# Patient Record
Sex: Male | Born: 1945 | Race: White | Hispanic: No | Marital: Married | Smoking: Never smoker
Health system: Southern US, Community
[De-identification: ages and names within clinical notes are randomized; demographics above are authoritative.]

## PROBLEM LIST (undated history)

## (undated) DIAGNOSIS — F419 Anxiety disorder, unspecified: Secondary | ICD-10-CM

## (undated) DIAGNOSIS — Z789 Other specified health status: Secondary | ICD-10-CM

## (undated) DIAGNOSIS — E7800 Pure hypercholesterolemia, unspecified: Secondary | ICD-10-CM

## (undated) DIAGNOSIS — R51 Headache: Secondary | ICD-10-CM

## (undated) DIAGNOSIS — K759 Inflammatory liver disease, unspecified: Secondary | ICD-10-CM

## (undated) DIAGNOSIS — D751 Secondary polycythemia: Secondary | ICD-10-CM

## (undated) DIAGNOSIS — R5382 Chronic fatigue, unspecified: Secondary | ICD-10-CM

## (undated) DIAGNOSIS — C4491 Basal cell carcinoma of skin, unspecified: Secondary | ICD-10-CM

## (undated) DIAGNOSIS — K5900 Constipation, unspecified: Secondary | ICD-10-CM

## (undated) DIAGNOSIS — I10 Essential (primary) hypertension: Secondary | ICD-10-CM

## (undated) DIAGNOSIS — K219 Gastro-esophageal reflux disease without esophagitis: Secondary | ICD-10-CM

## (undated) DIAGNOSIS — S060X9A Concussion with loss of consciousness of unspecified duration, initial encounter: Secondary | ICD-10-CM

## (undated) DIAGNOSIS — R413 Other amnesia: Secondary | ICD-10-CM

## (undated) DIAGNOSIS — K859 Acute pancreatitis without necrosis or infection, unspecified: Secondary | ICD-10-CM

## (undated) HISTORY — DX: Chronic fatigue, unspecified: R53.82

## (undated) HISTORY — DX: Other amnesia: R41.3

## (undated) HISTORY — PX: SHOULDER ARTHROSCOPY: SHX128

## (undated) HISTORY — DX: Other specified health status: Z78.9

## (undated) HISTORY — DX: Secondary polycythemia: D75.1

## (undated) HISTORY — PX: APPENDECTOMY: SHX54

## (undated) HISTORY — DX: Concussion with loss of consciousness of unspecified duration, initial encounter: S06.0X9A

## (undated) HISTORY — DX: Essential (primary) hypertension: I10

## (undated) HISTORY — DX: Headache: R51

## (undated) HISTORY — PX: MOHS SURGERY: SUR867

---
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
## 1989-05-05 HISTORY — PX: CATARACT EXTRACTION W/ INTRAOCULAR LENS  IMPLANT, BILATERAL: SHX1307

## 2000-09-25 ENCOUNTER — Encounter: Admission: RE | Admit: 2000-09-25 | Discharge: 2000-09-25 | Payer: Self-pay | Admitting: Family Medicine

## 2000-09-25 ENCOUNTER — Encounter: Payer: Self-pay | Admitting: Family Medicine

## 2004-09-04 HISTORY — PX: ORIF CLAVICLE FRACTURE: SUR924

## 2005-02-24 ENCOUNTER — Encounter: Admission: RE | Admit: 2005-02-24 | Discharge: 2005-02-24 | Payer: Self-pay | Admitting: Family Medicine

## 2005-02-26 ENCOUNTER — Encounter: Admission: RE | Admit: 2005-02-26 | Discharge: 2005-02-26 | Payer: Self-pay | Admitting: Family Medicine

## 2005-03-20 ENCOUNTER — Encounter: Admission: RE | Admit: 2005-03-20 | Discharge: 2005-03-20 | Payer: Self-pay | Admitting: Family Medicine

## 2005-04-08 ENCOUNTER — Encounter: Admission: RE | Admit: 2005-04-08 | Discharge: 2005-04-08 | Payer: Self-pay | Admitting: Family Medicine

## 2005-05-01 ENCOUNTER — Ambulatory Visit (HOSPITAL_COMMUNITY): Admission: RE | Admit: 2005-05-01 | Discharge: 2005-05-01 | Payer: Self-pay | Admitting: Specialist

## 2005-05-02 ENCOUNTER — Ambulatory Visit (HOSPITAL_COMMUNITY): Admission: RE | Admit: 2005-05-02 | Discharge: 2005-05-03 | Payer: Self-pay | Admitting: Orthopedic Surgery

## 2005-08-03 ENCOUNTER — Encounter: Admission: RE | Admit: 2005-08-03 | Discharge: 2005-08-03 | Payer: Self-pay | Admitting: Family Medicine

## 2005-09-04 HISTORY — PX: CLAVICLE HARDWARE REMOVAL: SHX1351

## 2005-12-30 ENCOUNTER — Encounter: Admission: RE | Admit: 2005-12-30 | Discharge: 2005-12-30 | Payer: Self-pay | Admitting: Neurological Surgery

## 2006-09-04 HISTORY — PX: US ECHOCARDIOGRAPHY: HXRAD669

## 2006-09-04 HISTORY — PX: CARDIAC CATHETERIZATION: SHX172

## 2006-10-12 ENCOUNTER — Encounter: Admission: RE | Admit: 2006-10-12 | Discharge: 2006-10-12 | Payer: Self-pay | Admitting: Neurology

## 2006-10-26 ENCOUNTER — Encounter: Admission: RE | Admit: 2006-10-26 | Discharge: 2006-10-26 | Payer: Self-pay | Admitting: Neurology

## 2006-11-12 ENCOUNTER — Encounter: Admission: RE | Admit: 2006-11-12 | Discharge: 2006-11-12 | Payer: Self-pay | Admitting: Family Medicine

## 2006-11-13 ENCOUNTER — Encounter: Admission: RE | Admit: 2006-11-13 | Discharge: 2006-11-13 | Payer: Self-pay | Admitting: Family Medicine

## 2006-11-22 ENCOUNTER — Ambulatory Visit (HOSPITAL_COMMUNITY): Admission: RE | Admit: 2006-11-22 | Discharge: 2006-11-22 | Payer: Self-pay | Admitting: Family Medicine

## 2006-12-04 ENCOUNTER — Ambulatory Visit: Payer: Self-pay

## 2006-12-04 ENCOUNTER — Encounter: Payer: Self-pay | Admitting: Internal Medicine

## 2007-01-24 ENCOUNTER — Ambulatory Visit: Payer: Self-pay | Admitting: Cardiology

## 2007-02-06 ENCOUNTER — Ambulatory Visit: Payer: Self-pay

## 2007-02-15 ENCOUNTER — Ambulatory Visit: Payer: Self-pay | Admitting: Cardiology

## 2007-03-14 ENCOUNTER — Ambulatory Visit: Payer: Self-pay | Admitting: Cardiology

## 2007-03-14 LAB — CONVERTED CEMR LAB
BUN: 10 mg/dL (ref 6–23)
Basophils Absolute: 0 10*3/uL (ref 0.0–0.1)
Creatinine, Ser: 0.9 mg/dL (ref 0.4–1.5)
Eosinophils Absolute: 0.2 10*3/uL (ref 0.0–0.6)
GFR calc Af Amer: 110 mL/min
GFR calc non Af Amer: 91 mL/min
HCT: 48.1 % (ref 39.0–52.0)
Hemoglobin: 16.6 g/dL (ref 13.0–17.0)
INR: 0.9 (ref 0.9–2.0)
MCHC: 34.5 g/dL (ref 30.0–36.0)
MCV: 97.1 fL (ref 78.0–100.0)
Monocytes Absolute: 0.8 10*3/uL — ABNORMAL HIGH (ref 0.2–0.7)
Monocytes Relative: 9.5 % (ref 3.0–11.0)
Neutrophils Relative %: 71.7 % (ref 43.0–77.0)
Potassium: 3.9 meq/L (ref 3.5–5.1)
RDW: 11.6 % (ref 11.5–14.6)
Sodium: 138 meq/L (ref 135–145)
aPTT: 26.7 s (ref 26.5–36.5)

## 2007-03-19 ENCOUNTER — Inpatient Hospital Stay (HOSPITAL_BASED_OUTPATIENT_CLINIC_OR_DEPARTMENT_OTHER): Admission: RE | Admit: 2007-03-19 | Discharge: 2007-03-19 | Payer: Self-pay | Admitting: Cardiology

## 2007-03-19 ENCOUNTER — Ambulatory Visit: Payer: Self-pay | Admitting: Cardiology

## 2007-04-10 ENCOUNTER — Ambulatory Visit: Payer: Self-pay | Admitting: Cardiology

## 2007-06-07 ENCOUNTER — Encounter: Admission: RE | Admit: 2007-06-07 | Discharge: 2007-06-07 | Payer: Self-pay | Admitting: Neurology

## 2008-01-23 ENCOUNTER — Encounter: Admission: RE | Admit: 2008-01-23 | Discharge: 2008-01-23 | Payer: Self-pay | Admitting: Family Medicine

## 2008-02-03 ENCOUNTER — Emergency Department (HOSPITAL_COMMUNITY): Admission: EM | Admit: 2008-02-03 | Discharge: 2008-02-03 | Payer: Self-pay | Admitting: Emergency Medicine

## 2008-02-14 ENCOUNTER — Encounter: Admission: RE | Admit: 2008-02-14 | Discharge: 2008-02-14 | Payer: Self-pay | Admitting: Family Medicine

## 2008-04-06 ENCOUNTER — Encounter: Admission: RE | Admit: 2008-04-06 | Discharge: 2008-04-06 | Payer: Self-pay | Admitting: Family Medicine

## 2008-08-03 ENCOUNTER — Encounter: Admission: RE | Admit: 2008-08-03 | Discharge: 2008-08-03 | Payer: Self-pay | Admitting: Neurological Surgery

## 2008-09-04 HISTORY — PX: ANTERIOR CERVICAL DECOMP/DISCECTOMY FUSION: SHX1161

## 2009-05-04 ENCOUNTER — Encounter: Admission: RE | Admit: 2009-05-04 | Discharge: 2009-05-04 | Payer: Self-pay | Admitting: Neurological Surgery

## 2009-05-18 ENCOUNTER — Ambulatory Visit (HOSPITAL_COMMUNITY): Admission: RE | Admit: 2009-05-18 | Discharge: 2009-05-18 | Payer: Self-pay | Admitting: Neurological Surgery

## 2009-05-20 ENCOUNTER — Observation Stay (HOSPITAL_COMMUNITY): Admission: RE | Admit: 2009-05-20 | Discharge: 2009-05-21 | Payer: Self-pay | Admitting: Neurological Surgery

## 2009-06-28 ENCOUNTER — Encounter: Admission: RE | Admit: 2009-06-28 | Discharge: 2009-06-28 | Payer: Self-pay | Admitting: Neurological Surgery

## 2009-08-09 ENCOUNTER — Encounter: Admission: RE | Admit: 2009-08-09 | Discharge: 2009-08-09 | Payer: Self-pay | Admitting: Neurological Surgery

## 2009-08-16 ENCOUNTER — Encounter: Admission: RE | Admit: 2009-08-16 | Discharge: 2009-08-16 | Payer: Self-pay | Admitting: Family Medicine

## 2009-11-08 ENCOUNTER — Encounter: Admission: RE | Admit: 2009-11-08 | Discharge: 2009-11-08 | Payer: Self-pay | Admitting: Neurological Surgery

## 2010-01-21 ENCOUNTER — Encounter: Admission: RE | Admit: 2010-01-21 | Discharge: 2010-01-21 | Payer: Self-pay | Admitting: Neurological Surgery

## 2010-07-27 ENCOUNTER — Ambulatory Visit: Payer: Self-pay | Admitting: Cardiology

## 2010-08-04 HISTORY — PX: OTHER SURGICAL HISTORY: SHX169

## 2010-08-19 ENCOUNTER — Ambulatory Visit: Payer: Self-pay | Admitting: Cardiology

## 2010-08-30 ENCOUNTER — Telehealth (INDEPENDENT_AMBULATORY_CARE_PROVIDER_SITE_OTHER): Payer: Self-pay | Admitting: *Deleted

## 2010-08-31 ENCOUNTER — Encounter (HOSPITAL_COMMUNITY)
Admission: RE | Admit: 2010-08-31 | Discharge: 2010-10-04 | Payer: Self-pay | Source: Home / Self Care | Attending: Cardiology | Admitting: Cardiology

## 2010-08-31 ENCOUNTER — Ambulatory Visit: Payer: Self-pay

## 2010-08-31 ENCOUNTER — Encounter: Payer: Self-pay | Admitting: Cardiology

## 2010-09-15 ENCOUNTER — Ambulatory Visit: Payer: Self-pay | Admitting: *Deleted

## 2010-09-25 ENCOUNTER — Encounter: Payer: Self-pay | Admitting: Family Medicine

## 2010-09-25 ENCOUNTER — Encounter: Payer: Self-pay | Admitting: Neurological Surgery

## 2010-09-25 ENCOUNTER — Encounter: Payer: Self-pay | Admitting: Neurology

## 2010-09-30 ENCOUNTER — Encounter
Admission: RE | Admit: 2010-09-30 | Discharge: 2010-09-30 | Payer: Self-pay | Source: Home / Self Care | Attending: Gastroenterology | Admitting: Gastroenterology

## 2010-10-05 ENCOUNTER — Ambulatory Visit (INDEPENDENT_AMBULATORY_CARE_PROVIDER_SITE_OTHER): Payer: Medicare Other | Admitting: *Deleted

## 2010-10-05 DIAGNOSIS — I10 Essential (primary) hypertension: Secondary | ICD-10-CM

## 2010-10-06 NOTE — Assessment & Plan Note (Signed)
Summary: Cardiology Nuclear Testing  Nuclear Med Background Indications for Stress Test: Evaluation for Ischemia, Abnormal EKG  Indications Comments: Pending exercise program.  History: Echo, Heart Catheterization, Myocardial Perfusion Study  History Comments: '08 MPS:?inferior wall scar, no ischemia, EF=47%; '08 Cath:n/o CAD, EF=53%  Symptoms: Chest Pain, Chest Pressure, DOE, Fatigue, Fatigue with Exertion, Light-Headedness    Nuclear Pre-Procedure Cardiac Risk Factors: History of Smoking, Hypertension, Lipids, Overweight Caffeine/Decaff Intake: none NPO After: 8:00 PM Lungs: Clear IV 0.9% NS with Angio Cath: 18g     IV Site: R Antecubital IV Started by: Stanton Kidney, EMT-P Chest Size (in) 42     Height (in): 74 Weight (lb): 208 BMI: 26.80 Tech Comments: Metoprolol held > 48 hrs, per Pt.  Nuclear Med Study 1 or 2 day study:  1 day     Stress Test Type:  Stress Reading MD:  Olga Millers, MD     Referring MD:  Cassell Clement Resting Radionuclide:  Technetium 34m Tetrofosmin     Resting Radionuclide Dose:  11.0 mCi  Stress Radionuclide:  Technetium 54m Tetrofosmin     Stress Radionuclide Dose:  33.0 mCi   Stress Protocol Exercise Time (min):  4:45 min     Max HR:  121 bpm     Predicted Max HR:  155 bpm  Max Systolic BP: 205 mm Hg     Percent Max HR:  78.06 %     METS: 5.8 Rate Pressure Product:  40102  Lexiscan: 0.4 mg   Stress Test Technologist:  Irean Hong,  RN     Nuclear Technologist:  Doyne Keel, CNMT  Rest Procedure  Myocardial perfusion imaging was performed at rest 45 minutes following the intravenous administration of Technetium 47m Tetrofosmin.  Stress Procedure  The patient attempted to walk treadmill utilizing the Bruce protocol for 4:45, but the treadmill was stopped due to a hypertensive response, BP 205/119 off metoprolol 48 hrs.The patient received IV Lexiscan 0.4 mg over 15-seconds.  Technetium 31m Tetrofosmin injected at 30-seconds.  There were no  significant changes, rare PVC.  Quantitative spect images were obtained after a 45 minute delay.Toprol xl 25 mg 1/2 tablet given after lexiscan, and BP 160/90 after 2nd images.  QPS Raw Data Images:  There is no interference from other nuclear activity. Stress Images:  There is decreased uptake in the apex. Rest Images:  There is decreased uptake in the apex. Subtraction (SDS):  No evidence of ischemia. Transient Ischemic Dilatation:  1.12  (Normal <1.22)  Lung/Heart Ratio:  .29  (Normal <0.45)  Quantitative Gated Spect Images QGS EDV:  132 ml QGS ESV:  67 ml QGS EF:  49 % QGS cine images:  Normal wall motion; LV function visually appears better than calculated EF; suggest echo to better evaluate; LVE.   Overall Impression  Exercise Capacity: Lexiscan with low level exercise BP Response: Hypertensive blood pressure response. Clinical Symptoms: No chest pain ECG Impression: No significant ST segment change suggestive of ischemia. Overall Impression: Normal lexiscan nuclear study with apical thinning but no ischemia.  Appended Document: Cardiology Nuclear Testing COPY SENT TO DR.BRACKBILL

## 2010-10-06 NOTE — Progress Notes (Signed)
Summary: Nuclear pre procedure  Phone Note Outgoing Call Call back at [facility] Phone [phone]   Call placed by: Rea College, CMA,  August 30, 2010 5:21 PM Call placed to: Patient Summary of Call: Left message with information on Myoview Information Sheet (see scanned document for details).      Nuclear Med Background Indications for Stress Test: Evaluation for Ischemia, Abnormal EKG   History: Echo, Heart Catheterization, Myocardial Perfusion Study  History Comments: '08 MPS:?inferior wall scar, no ischemia, EF=47%; '08 Cath:n/o CAD, EF=53%  Symptoms: DOE, Light-Headedness    Nuclear Pre-Procedure Cardiac Risk Factors: Hypertension, Lipids, Overweight

## 2010-11-10 ENCOUNTER — Ambulatory Visit: Payer: Medicare Other | Admitting: Cardiology

## 2010-11-17 ENCOUNTER — Ambulatory Visit (INDEPENDENT_AMBULATORY_CARE_PROVIDER_SITE_OTHER): Payer: Medicare Other | Admitting: Cardiology

## 2010-11-17 DIAGNOSIS — Z79899 Other long term (current) drug therapy: Secondary | ICD-10-CM

## 2010-11-17 DIAGNOSIS — I119 Hypertensive heart disease without heart failure: Secondary | ICD-10-CM

## 2010-12-01 ENCOUNTER — Ambulatory Visit: Payer: Medicare Other | Admitting: Cardiology

## 2010-12-09 LAB — DIFFERENTIAL
Eosinophils Absolute: 0.1 10*3/uL (ref 0.0–0.7)
Lymphocytes Relative: 34 % (ref 12–46)
Lymphs Abs: 1.6 10*3/uL (ref 0.7–4.0)
Monocytes Relative: 10 % (ref 3–12)
Neutro Abs: 2.5 10*3/uL (ref 1.7–7.7)
Neutrophils Relative %: 53 % (ref 43–77)

## 2010-12-09 LAB — BASIC METABOLIC PANEL
Calcium: 9.7 mg/dL (ref 8.4–10.5)
Creatinine, Ser: 1.04 mg/dL (ref 0.4–1.5)
GFR calc non Af Amer: >60 mL/min (ref >60)
Sodium: 140 mEq/L (ref 135–145)

## 2010-12-09 LAB — APTT: aPTT: 25 seconds (ref 24–37)

## 2010-12-09 LAB — PROTIME-INR
INR: 1.1 (ref 0.00–1.49)
Prothrombin Time: 13.7 seconds (ref 11.6–15.2)

## 2010-12-09 LAB — CBC
MCV: 101 fL — ABNORMAL HIGH (ref 78.0–100.0)
Platelets: 219 10*3/uL (ref 150–400)
RBC: 5.43 MIL/uL (ref 4.22–5.81)
WBC: 4.7 10*3/uL (ref 4.0–10.5)

## 2011-01-16 ENCOUNTER — Encounter: Payer: Self-pay | Admitting: *Deleted

## 2011-01-17 ENCOUNTER — Ambulatory Visit: Payer: Medicare Other | Admitting: Cardiology

## 2011-01-17 NOTE — Assessment & Plan Note (Signed)
[facility]                            CARDIOLOGY OFFICE NOTE   [name], [name]                          MRN:          [id number]  DATE:03/14/2007                            DOB:          10/15/1945    Brian Villegas is here for follow up. Please review my notes of 01/24/2007  and 02/15/2007. It is time to proceed with cardiac catheterization. The  patient has been away on a trip. He did develop a significant episode of  bronchitis and came from traveling in [place]. He has not had any severe  chest pain. Note that Dr. Delton Coombes had thought that from the pulmonary  function studies that there might be some type of upper airway or vocal  cord dysfunction. This might need further review and the patient may  still need further pulmonary workup. However, his echocardiogram raised  the question of a small abnormality and his nuclear scan raised the  question of a small abnormality in the inferior wall and this raises the  question of some inferior injury. It is time to proceed with  catheterization to be definitive in the diagnosis.   The patient is here with his wife today and I re-explained the entire  situation to him.   PAST MEDICAL HISTORY:   ALLERGIES:  CODEINE.   MEDICATIONS:  1. Ambien.  2. Aspirin 325 mg.  3. Metoprolol 12.5 mg daily.   OTHER MEDICAL PROBLEMS:  See the list below.   REVIEW OF SYSTEMS:  He is having some fatigue and some sinus  difficulties. He does have some shortness of breath with exertion.  Otherwise, his review of systems is negative.   PHYSICAL EXAMINATION:  VITAL SIGNS:  Weight 190 pounds. Blood pressure  is 138/95.   I did not examine the patient today. Most recently, his exam has  revealed no major murmurs and no major abnormalities. The patient  is  stable.   PROBLEMS:  1. CODEINE allergy.  2. History of some type of alcohol made by friends in the past. I do      not know if heavy metal studies were done, but they  may have been      done through Dr. Sandria Manly.  3. Overall fatigue.  4. Intermittent dyspnea, etiology remains unclear.  5. Some type of neurologic paresthesias with no definite diagnosis      that I am aware of.  6. Elevated LDL.  7. Polycythemia by history.  8. Macrocytosis.  9. 2D echocardiogram with a question of an inferior wall motion      abnormality and a stress Myoview with question of slight      abnormality in the inferior wall.  10.Sleep disorder.  11.Question of spinal stenosis on neurologic exam. He has not had a      myelogram. He has had heavy metal studies. They appear to be      normal, but my copy was not clear.  12.Pulmonary function studies by Dr. Delton Coombes. He may have some type of      upper airway or vocal cord dysfunction.  13.We  need to proceed with catheterization. The patient and his wife      are in agreement. After this, we will see about the next step. He      may need a cardiopulmonary exercise test.  14.High blood pressure. As we see him on follow up visits, his      diastolic pressure continues to be mildly elevated. I do believe      that this should be treated now that we have seen it on several      occasions.     Luis Abed, MD, [facility]  Electronically Signed    JDK/MedQ  DD: 03/14/2007  DT: 03/15/2007  Job #: [id number]   cc:   Mosetta Putt, M.D.

## 2011-01-17 NOTE — Assessment & Plan Note (Signed)
[facility]                            CARDIOLOGY OFFICE NOTE   RAFEAL, SKIBICKI                          MRN:          [id number]  DATE:04/10/2007                            DOB:          1945-12-12    POST-HOSPITAL OFFICE VISIT:   PRIMARY CARE PHYSICIAN:  Mosetta Putt, M.D.   SUBJECTIVE:  This is a 65 year old married white male patient who  underwent a cardiac catheterization on March 19, 2007, after an  evaluation for dyspnea and abnormal echocardiogram, suggesting inferior  wall hypokinesis and abnormal Cardiolite, suggesting inferior wall basal  and mild regional wall motion abnormalities, with overall preserved  ejection fraction but no ischemia or infarction.  The cardiac  catheterization was performed by Dr. Rollene Rotunda, and revealed mild  coronary plaque and overall well-preserved ejection fraction with  perhaps a mild inferior wall motion abnormality.  He recommended primary  risk reduction.  His LV end diastolic pressure was not elevated at the  time of the cardiac catheterization and did not suggest significant  diastolic dysfunction; however, this was suggested by echocardiogram,  and he felt he could be continued to be treated for this.   Since the patient has been home he is doing quite well.  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  He stopped taking his metoprolol because of his  catheterization results, but his blood pressure is elevated.   CURRENT MEDICATIONS:  Aspirin 325 mg daily.   PHYSICAL EXAMINATION:  GENERAL:  This is a pleasant 65 year old white  male, in no acute distress.  VITAL SIGNS:  Blood pressure 160/100, pulse 68, weight 193 pounds.  NECK:  Without jugular venous distention, HJR or bruit or thyroid  enlargement.  LUNGS:  Clear anterior, posterior and lateral.  HEART:  Regular rate and rhythm at 68 beats per minute.  Normal S1 and  S2.  No murmur, rub, bruit, thrill or heave noted.  ABDOMEN:  Soft, without organomegaly, masses, lesions or abnormal  tenderness.  Right groin without hematoma or hemorrhage.  EXTREMITIES:  Good distal pulses.  Extremities without clubbing,  cyanosis or edema.   IMPRESSION:  1. A 2-dimensional echocardiogram suggestive of diastolic dysfunction.      Does not correlate with the cardiac catheterization.  2. Mild coronary plaque with overall preserved ejection fraction with      perhaps mild inferior wall motion abnormality.  3. Hypertension, uncontrolled.  4. Intermittent dyspnea.  5. History of neurological paresthesia with no definite diagnosis.  6. Macrocytosis.  7. History of sleep disorder.  8. Question of spinal stenosis.   PLAN:  At this time I have asked the patient to resume his beta blocker  at 12.5 mg and have this followed with Dr. Mosetta Putt.  I told him  it is probably not wise to play racket ball at a high intensity level  until his blood pressure is under better control.  He may need to be  switched to another agent, as he had heart  rates in the 50's when he was  here in May, to get his blood pressure under better control.   FOLLOWUP:  He will see Dr. Luis Abed in about four months' time,  or sooner if needed.      Jacolyn Reedy, PA-C  Electronically Signed      Jesse Sans. Daleen Squibb, MD, [facility]  Electronically Signed   ML/MedQ  DD: 04/10/2007  DT: 04/10/2007  Job #: [id number]   cc:   Mosetta Putt, M.D.

## 2011-01-17 NOTE — Cardiovascular Report (Signed)
NAMEKHING, BELCHER NO.:  [id number]   MEDICAL RECORD NO.:  [id number]          PATIENT TYPE:  OIB   LOCATION:  1963                         FACILITY:  [facility]   PHYSICIAN:  Rollene Rotunda, MD, FACCDATE OF BIRTH:  10-22-1945   DATE OF PROCEDURE:  03/19/2007  DATE OF DISCHARGE:  03/19/2007                            CARDIAC CATHETERIZATION   PRIMARY CARE PHYSICIAN:  Mosetta Putt, M.D.   CARDIOLOGIST:  Luis Abed, MD, [facility].   PROCEDURE:  Left heart catheterization and coronary arteriography.   INDICATIONS:  Evaluate patient with dyspnea, abnormal echocardiogram  suggesting inferior wall hypokinesis, and an abnormal Cardiolite  suggesting inferior wall basal and mid regional wall motion  abnormalities, slight, with an overall well-preserved ejection fraction  but no ischemia or infarct.   PROCEDURE NOTE:  Left heart catheterization performed via the right  femoral artery.  The artery was cannulated using anterior wall puncture.  A #4-French arterial sheath was inserted via the modified Seldinger  technique.  Preformed Judkins and pigtail catheter were utilized.  The  patient tolerated the procedure well and left the lab in stable  condition.   RESULTS:   HEMODYNAMICS:  LV 149/17, AO 150/76.   CORONARIES:  Left main was normal.   The LAD was large, wrapping the apex.  There were proximal luminal  irregularities.  There were small diagonals which were free of any  disease.   The circumflex was a dominant vessel, very large in the AV groove.  It  was normal throughout its course in the AV groove. There was a very  large first obtuse marginal which was normal.  There was a  posterolateral which was moderate size and normal.  There was a PDA  which was moderate size and normal.   Right coronary artery is a nondominant vessel and normal.   LEFT VENTRICULOGRAM:  Left ventriculogram was obtained in the RAO  projection.  The EF was about  55% with mild  inferior hypokinesis.   CONCLUSION:  1. Mild coronary plaque.  2. Overall well-preserved ejection fraction with perhaps a mild      inferior wall motion abnormality.   PLAN:  The patient will continue to have primary risk reduction.  Of  note, his LV end-diastolic pressures were not elevated at this  catheterization and did not suggest significant diastolic dysfunction.  However, this was suggested by the echocardiogram.  He could continue to  be treated for this.  He also will follow up with Dr. Myrtis Ser and Dr.  Duaine Dredge for further potential evaluation of dyspnea.      Rollene Rotunda, MD, [facility]  Electronically Signed     JH/MEDQ  D:  03/19/2007  T:  03/19/2007  Job:  [id number]   cc:   Mosetta Putt, M.D.

## 2011-01-17 NOTE — Assessment & Plan Note (Signed)
Brian Villegas                            CARDIOLOGY OFFICE NOTE   Brian, Villegas                          MRN:          [id number]  DATE:01/24/2007                            DOB:          02/05/1946    [name] is here for cardiac evaluation.  He is active, including  playing racquetball.  He is 65 years of age.  He is bothered by several  different symptoms, which have been very difficult to evaluate.  Dr.  Duaine Villegas has been working with him to try to help assess multiple  issues.  These issues have included fatigue and dyspnea.  In fact,  several years ago he had an unusual feeling in his leg.  Eventually,  this included some paresthesias.  He had an extensive workup by Dr.  Sandria Villegas.  The patient tells me that there have been no marked abnormalities  found.  The patient has also seen Dr. Liz Villegas.  He has also had an  evaluation by Dr. Danielle Villegas, and Dr. Andrey Villegas.  There is a history of some  alcohol homemade by friends.  He had pulmonary function studies done at  [facility].  The results were read by Dr. Levy Villegas.  There were pre  and postbronchodilator evaluations.  I do not see a listing for  diffusion capacity.  FVC, FEV1, FEV1/FVC ratio, and FEF 25% to 75% are  all within normal.  The MVV is within normal.  There was no significant  response with vasodilators.  Comment from Dr. Delton Villegas, is that there is  normal airflow without a bronchodilator response.  Review of the  inspiratory flow/volume loop is suggestive of the possibility of  variable upper airway obstruction or vocal cord dysfunction.  The  patient's hemoglobin has been 45.  I have other labs, which were faxed,  and I will need to reassess with better copies.  HDL was 63, LDL was  162, and TSH was normal.  There was an assessment of VMA, epinephrine,  norepinephrine, and metanephrines.  It would appear that these are in  the normal range.  Another hemoglobin is 18.4, and  hematocrit 54,  compatible with some polycythemia.  Also, patient had a 2 dimensional  echo.  This was done in our office on December 04, 2006.  The study showed  an ejection fraction of 50% to 55%.  There is question of some  hypokinesis of the basal and mid inferior wall.  There is also question  of some diastolic dysfunction.  I have not yet personally reviewed this  echo.   Brian Villegas tells me that he is still able to play racquetball.  He feels  fatigued afterwards, but does not have chest pain or shortness of breath  while playing.  Her intermittently has shortness of breath when walking  up stairs towards his office.  He has not had syncope or presyncope.   PAST MEDICAL HISTORY:   ALLERGIES:  HE IS ALLERGIC TO CODEINE.   MEDICATIONS:  At the current time, he is using only Ambien.   REVIEW OF SYSTEMS:  See the HPI.  Another issue that has been raised is  that his blood pressure seems to be higher today than it has been  recently.  He has some mild dizziness.  He has some seasonal allergies.  Otherwise, his review of systems is negative.  He does have some  tinnitus.   SOCIAL HISTORY:  The patient is [employer]  He does  not smoke.  As noted above, he has over time had some alcoholic  beverages made by friends.   FAMILY HISTORY:  There is no strong family history of coronary disease.   PHYSICAL EXAMINATION:  Blood pressure today varies from 140 to 160 over  95 to 105.  He has not had pressures in this range before, and this can  be followed for today.  His weight is 193 pounds.  Patient is oriented to person, time, and place.  Affect is normal.  He has a mildly cyanotic tint to his skin.  There is no xanthelasma.  There is normal extraocular motion.  Conjunctivae are normal.  There is no jugular venous distention.  There are no carotid bruits.  LUNGS:  Clear.  Respiratory effort is not labored.  CARDIAC:  Exam reveals an S1 with an S2.  There are no  clicks or  significant murmurs.  ABDOMEN:  Soft.  There are no masses or bruits.  There are normal bowel  sounds.  He has 2+ distal pulses.  There is no peripheral edema.  There are no major musculoskeletal deformities.  There is a slight  variation in his left clavicular area, and his left shoulder, related to  some reconstructive surgery that he has had.   IMPRESSION:  At this point, I am not sure of the exact etiology of many  of his symptoms.  Problems are listed below, and we will proceed with  further evaluation.   PROBLEMS:  Include:  1. Codeine allergy.  2. History of having had some alcohol in the past made by some      friends.  I do not know if any type of chemical studies or heavy      metal studies have been done, but may have been done through Dr.      Sandria Villegas.  3. Overall fatigue.  4. Intermittent dyspnea.  Etiology remains unclear.  We need to be      sure that he does not have any anginal equivalent.  5. Some type of neurologic paresthesias with no definite diagnosis      made that I am aware of.  6. LDL 162.  This will have to be approached after we understand his      overall status better.  7. Skin tone that appears somewhat cyanotic.  8. Polycythemia.  9. Macrocytosis.  10.A 2-D echo showing question of an inferior wall motion abnormality.      He will have a stress Myoview scan to rule out ischemic disease.  11.Sleep disorder.  12.Question of spinal stenosis related to the neurologic exam.      However, he has not had a myelogram.  I see now from the labs, that      he has had heavy metal studies.  I will obtain a better copy, but I      am assuming that these are normal.  13.Pulmonary function studies as outlined above.  In Dr. Kavin Leech note,      there is the issue of the possibility of some type of upper  airway      obstruction or vocal cord dysfunction, and I will have to see if     this has been assessed or considered any further.   We will start with a  stress Myoview scan.  I will then see him back for  followup.  I will review his 2-D echo to personally look at the question  of the inferior wall motion abnormality.  It is possible that he might  need a cardiopulmonary exercise test to further assess his exercise  capacity, and  get a better feel for whether or not there is a true cardiovascular or  pulmonary component to it.  I will see him back for followup.     Luis Abed, MD, [facility]  Electronically Signed    JDK/MedQ  DD: 01/24/2007  DT: 01/24/2007  Job #: [id number]   cc:   Mosetta Putt, M.D.

## 2011-01-17 NOTE — Assessment & Plan Note (Signed)
[facility]                            CARDIOLOGY OFFICE NOTE   Brian, Villegas                          MRN:          [id number]  DATE:02/15/2007                            DOB:          30-Jul-1946    I saw [name] with a complete evaluation on Jan 24, 2007. At that  time, one of the issues was intermittent dyspnea. The exact etiology  remains unclear. Dr. Delton Coombes had thought from his pulmonary function  studies that there was a possibility that he had some type of upper  airway obstruction or vocal cord dysfunction that was intermittent. This  may yet need further review. From my view point, I felt that it was  important to do a Myoview scan. This was done with an adenosine study.  The study shows that there is decreased activity at the base of the  inferolateral wall. This seems similar at rest and stress. There was no  marked reversibility. There appeared to be some relative inferior  hypokinesis and therefore this area could possibly be a small area of  scar. There was no definite ischemia.   I reviewed this data and reviewed the data completely with the patient  today. I explained to him that it is possible that this could be a real  finding with a small amount of old injury in this area. I had reviewed  the echo. I will re-review my thoughts on his echo. However, I made it  clear to him that this raised the question that it is possible that  there could be a mild abnormality in this area. This is a low risk scan.  He is getting ready to travel for several weeks and I feel it is safe  for him to do so. I decided to put him on an aspirin a day and give him  nitroglycerin to carry and put him on a small dose of a beta blocker. I  will then see him back. He arrives back on July 7th. I will see him on  July 10 and we will make a final plan for outpatient catheterization.   PAST MEDICAL HISTORY:   ALLERGIES:  CODEINE.   MEDICATIONS:  Ambien  and medications added today including aspirin,  p.r.n. nitroglycerin, and low dose metoprolol extended release.   OTHER MEDICAL PROBLEMS:  See the extensive note on Jan 24, 2007.   REVIEW OF SYSTEMS:  Since being here, he has not had any major problems.   LIMITED PHYSICAL EXAMINATION:  Blood pressure today is 150/92 with a  pulse of 78 and as mentioned we are adding a beta blocker. This pressure  is better than at the time of his last visit. I am hesitant to add other  medications as he is getting ready to travel out of town.   Problems are listed extensively on my note of Jan 24, 2007. It is  possible that his dyspnea could be an anginal equivalent but there is no  proof of that at this time. I reviewed all of the  information very carefully as outlined. I  will see him back on July 10  and we will finalize proceeding heart catheterization at that time.     Brian Abed, MD, [facility]  Electronically Signed    JDK/MedQ  DD: 02/15/2007  DT: 02/15/2007  Job #: [id number]   cc:   Brian Villegas, M.D.

## 2011-01-20 NOTE — Op Note (Signed)
Brian Villegas, Brian Villegas                 ACCOUNT NO.:  [id number]   MEDICAL RECORD NO.:  [id number]          PATIENT TYPE:  OIB   LOCATION:  5022                         FACILITY:  [facility]   PHYSICIAN:  Almedia Balls. Ranell Patrick, M.D. DATE OF BIRTH:  10-03-1945   DATE OF PROCEDURE:  05/02/2005  DATE OF DISCHARGE:  05/03/2005                                 OPERATIVE REPORT   PREOPERATIVE DIAGNOSIS:  Left clavicle fracture, comminuted and displaced.   POSTOPERATIVE DIAGNOSIS:  Left clavicle fracture, comminuted and displaced.   OPERATION PERFORMED:  Open reduction and internal fixation, left clavicle,  using intramedullary pin by DePuy.   SURGEON:  Almedia Balls. Ranell Patrick, M.D.   ASSISTANT:  __________ , P. A.-C   ANESTHESIA:  General anesthesia.   ESTIMATED BLOOD LOSS:  The estimated blood loss was minimal.   FLUIDS REPLACED:  Fluid replacement was 1,200 mL of crystalloid.   COUNTS:  Instrument count was correct.   COMPLICATIONS:  There were no complications.   MEDICATIONS:  No antibiotics given.   INDICATIONS:  The patient is a 65 year old male with a history of a left  displaced comminuted clavicle fracture who presents with obvious shortening  of his left upper extremity.  The patient desires operative treatment to  restore clavicle length and alignment, and to insure proper healing.  Informed consent was obtained.   DESCRIPTION OF THE OPERATION:  After an adequate level of anesthesia was  achieved the patient was positioned in the modified beach chair position.  The C-arm was brought in to visualize the fracture during surgery.  The  shoulder was sterilely prepped and draped in the usual manner.  The C-arm  was brought over the top of the clavicle.   We placed a long Langer's line incision directly overlying the clavicular  fracture.  We dissected down to the platysma and trapezius muscle fibers.  Protecting the subcutaneous nerves we identified the fracture site.  We were  able to  mobilize both ends of the fracture site without undo soft tissue  stripping. We then drilled out the medial and lateral fragments and then  retrograded a DePuy clavicle pin out the lateral fragment.  We then reduced  the fracture and antegraded the pin across the medial fracture.  This gained  outstanding alignment of the fracture as visualized by the C-arm and as  visualized clinically through the wound.   We thoroughly irrigated the wound. We approximated local bone that was still  attached to the soft tissue, and used a single 0-Vicryl suture to hold the  bone fragments in place.  We then closed the platysma muscle over the top of  the clavicle utilizing interrupted 2-0 Vicryl suture followed by 2-0 Vicryl  subcutaneous and 4-0 Monocryl for the skin.  The posterior wound was closed  in a layered closure as well.   The patient was awakened, placed in a shoulder sling and taken to the  recovery room in stable condition.           ______________________________  Almedia Balls Ranell Patrick, M.D.     SRN/MEDQ  D:  06/09/2005  T:  06/09/2005  Job:  [id number]

## 2011-01-20 NOTE — Op Note (Signed)
Brian Villegas, Brian Villegas                 ACCOUNT NO.:  [id number]   MEDICAL RECORD NO.:  [id number]          PATIENT TYPE:  OIB   LOCATION:                               FACILITY:  [facility]   PHYSICIAN:  Almedia Balls. Ranell Patrick, M.D. DATE OF BIRTH:  10/14/1945   DATE OF PROCEDURE:  05/02/2005  DATE OF DISCHARGE:                                 OPERATIVE REPORT   PREOPERATIVE DIAGNOSIS:  Left clavicle fracture, displaced, comminuted,  shortened.   POSTOPERATIVE DIAGNOSIS:  Left clavicle fracture, displaced, comminuted,  shortened.   PROCEDURE PERFORMED:  Open reduction and internal fixation, left clavicle,  using DePuy clavicle pin.   ATTENDING SURGEON:  Almedia Balls. Ranell Patrick, M.D.   ASSISTANT:  Donnie Coffin. Durwin Nora, P.A.   ANESTHESIA:  General.   ESTIMATED BLOOD LOSS:  Minimal.   FLUID REPLACEMENT:  1200 cc crystalloid.   INSTRUMENT COUNT:  Correct.   COMPLICATIONS:  No known complications.   PERIOPERATIVE ANTIBIOTICS:  Given.   INDICATIONS:  The patient is an otherwise healthy gentleman who sustained a  high-energy fall onto an outstretched left arm and left shoulder.  He had  immediate shoulder deformity and pain.  He presented with a shortened,  comminuted, displaced clavicle fracture requiring open reduction and  internal fixation.  I have discussed operative and nonoperative treatment  options with the patient.  He elected to proceed with surgery.  Informed  consent was obtained.   DESCRIPTION OF THE OPERATION:  After an adequate level of anesthesia was  achieved, the patient was positioned in the modified beach-chair position.  The C-arm was brought in and utilized over the top of the patient for  assistance with surgery.  After sterile prep and drape of the left shoulder,  we entered the fracture site through a small Langer's line incision directly  overlying the fracture.  We carefully moved the supraclavicular nerves out  of the way, identified the fracture site, drilled out the  medial then  lateral fragment, retrograded a 3-mm DePuy clavicle pin out the lateral  fragment, reduced the fracture under direct visualization, and then advanced  the pin across the fracture site.  We placed the medial and lateral nuts and  then clipped the pin adjacent to the nuts.  We used adjacent available local  bone graft in the fracture site and then turned the large butterfly fragment  back in line with the  fracture.  We then thoroughly irrigated and closed the platysma muscle and  then the subcutaneous and skin using layered closure with running Monocryl  under the skin.  The posterior incision was closed in layered closure.  The  patient tolerated surgery well, we placed in a shoulder sling, and taken to  the recovery room.           ______________________________  Almedia Balls Ranell Patrick, M.D.     SRN/MEDQ  D:  05/11/2005  T:  05/11/2005  Job:  [id number]

## 2011-01-31 ENCOUNTER — Ambulatory Visit (INDEPENDENT_AMBULATORY_CARE_PROVIDER_SITE_OTHER): Payer: Medicare Other | Admitting: Cardiology

## 2011-01-31 ENCOUNTER — Encounter: Payer: Self-pay | Admitting: Cardiology

## 2011-01-31 VITALS — BP 126/90 | HR 84 | Wt 206.0 lb

## 2011-01-31 DIAGNOSIS — I119 Hypertensive heart disease without heart failure: Secondary | ICD-10-CM

## 2011-01-31 DIAGNOSIS — R5381 Other malaise: Secondary | ICD-10-CM

## 2011-01-31 DIAGNOSIS — R5383 Other fatigue: Secondary | ICD-10-CM | POA: Insufficient documentation

## 2011-01-31 MED ORDER — BISOPROLOL FUMARATE 5 MG PO TABS: 5.0000 mg | ORAL_TABLET | Freq: Every day | ORAL | Status: DC

## 2011-01-31 NOTE — Progress Notes (Signed)
Finis Bud Date of Birth:  Jan 04, 1946 [facility] [address]   [address] [place], [place]  [address] [phone]           Fax   [phone]  HPI: This pleasant 65 year old gentleman is seen for a two-month followup office visit.  He has a history of essential hypertension.  He is intolerant to many medications he has been tolerating his present medications which are hydrochlorothiazide 25 mg daily and diltiazem CD 240 mg daily.  Is not on any chest pain or palpitations.  He had an echocardiogram on 12/04/06 which showed normal LV systolic function and a pseudo-normal left ventricular filling pattern.  There is trivial aortic valvular regurgitation.  The patient had a nuclear stress test on 09/01/10 which showed no ischemia and which showed an ejection fraction of 49% and a hypertensive blood pressure response to exercise.  Current Outpatient Prescriptions  Medication Sig Dispense Refill  . ALPRAZolam (XANAX) 0.5 MG tablet Take 1 tablet by mouth as needed.      . hydrochlorothiazide 25 MG tablet Take 25 mg by mouth daily.        . pantoprazole (PROTONIX) 40 MG tablet Take 1 tablet by mouth Daily.      . Vardenafil HCl (LEVITRA PO) Take by mouth as needed.        . zolpidem (AMBIEN) 10 MG tablet Take 10 mg by mouth at bedtime as needed.        [name] DISCONTD: diltiazem (CARDIZEM CD) 240 MG 24 hr capsule Take 240 mg by mouth daily. ( taking 2 of the 120 mg tablets until the prescription runs out )      . DISCONTD: diltiazem (CARDIZEM) 120 MG tablet Take 120 mg by mouth 4 (four) times daily.        . bisoprolol (ZEBETA) 5 MG tablet Take 1 tablet (5 mg total) by mouth daily.  30 tablet  11    Allergies  Allergen Reactions  . Amlodipine   . Bystolic (Nebivolol Hcl)   . Codeine   . Lisinopril   . Losartan   . Toprol Xl (Metoprolol Succinate)   . Triamterene     Patient Active Problem List  Diagnoses  . Benign hypertensive heart disease without heart failure   . Malaise and fatigue    History  Smoking status  . Never Smoker   Smokeless tobacco  . Not on file    History  Alcohol Use: Not on file    No family history on file.  Review of Systems: The patient denies any heat or cold intolerance.  No weight gain or weight loss.  The patient denies headaches or blurry vision.  There is no cough or sputum production.  The patient denies dizziness.  There is no hematuria or hematochezia.  The patient denies any muscle aches or arthritis.  The patient denies any rash.  The patient denies frequent falling or instability.  There is no history of depression or anxiety.  All other systems were reviewed and are negative.   Physical Exam: Filed Vitals:   01/31/11 1644  BP: 126/90  Pulse: 84  The general appearance reveals a well-developed well-nourished gentleman in no distress.Pupils equal and reactive.   Extraocular Movements are full.  There is no scleral icterus.  The mouth and pharynx are normal.  The neck is supple.  The carotids reveal no bruits.  The jugular venous pressure is normal.  The thyroid is not enlarged.  There  is no lymphadenopathy.The chest is clear to percussion and auscultation. There are no rales or rhonchi. Expansion of the chest is symmetrical.The precordium is quiet.  The first heart sound is normal.  The second heart sound is physiologically split.  There is no murmur gallop rub or click.  There is no abnormal lift or heave.The abdomen is soft and nontender. Bowel sounds are normal. The liver and spleen are not enlarged. There Are no abdominal masses. There are no bruits.The pedal pulses are good.  There is no phlebitis or edema.  There is no cyanosis or clubbing.Strength is normal and symmetrical in all extremities.  There is no lateralizing weakness.  There are no sensory deficits.  Normal integument.    Assessment / Plan: Continue present medication and and bisoprolol 5 mg tablets and he'll take one half tablet each morning.   If he does not tolerate bisoprolol we will consider going up higher on his Cardizem.  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

## 2011-01-31 NOTE — Assessment & Plan Note (Signed)
The patient has a long history of essential hypertension.  He has had intolerances to many of the normally employed medications for hypertension.  He has been on hydrochlorothiazide 25 mg daily and diltiazem CD 240 mg daily and so far seems to be tolerating it but does complain of fatigue.  He denies any chest pain or racing of his heart.

## 2011-01-31 NOTE — Assessment & Plan Note (Signed)
Patient complains of lack of energy.  He does acknowledge that he has not been getting regular exercise.  His weight is down slightly since last visit

## 2011-02-10 ENCOUNTER — Telehealth: Payer: Self-pay | Admitting: Cardiology

## 2011-02-10 DIAGNOSIS — I10 Essential (primary) hypertension: Secondary | ICD-10-CM

## 2011-02-10 MED ORDER — DILTIAZEM HCL ER COATED BEADS 120 MG PO CP24: 120.0000 mg | ORAL_CAPSULE | Freq: Two times a day (BID) | ORAL | Status: DC

## 2011-02-10 NOTE — Telephone Encounter (Signed)
Needed to clarify medications and refill on cardizem.

## 2011-02-10 NOTE — Telephone Encounter (Signed)
Pt wants to discuss what meds was called to pharmacy

## 2011-03-29 ENCOUNTER — Other Ambulatory Visit: Payer: Self-pay | Admitting: *Deleted

## 2011-03-29 DIAGNOSIS — I119 Hypertensive heart disease without heart failure: Secondary | ICD-10-CM

## 2011-03-29 MED ORDER — HYDROCHLOROTHIAZIDE 25 MG PO TABS: 25.0000 mg | ORAL_TABLET | Freq: Every day | ORAL | Status: DC

## 2011-03-29 NOTE — Telephone Encounter (Signed)
Refilled meds per fax request.  

## 2011-04-03 ENCOUNTER — Ambulatory Visit: Payer: Medicare Other | Admitting: Cardiology

## 2011-04-13 ENCOUNTER — Other Ambulatory Visit (HOSPITAL_COMMUNITY): Payer: Self-pay | Admitting: Gastroenterology

## 2011-04-13 DIAGNOSIS — R1013 Epigastric pain: Secondary | ICD-10-CM

## 2011-04-25 ENCOUNTER — Other Ambulatory Visit (HOSPITAL_COMMUNITY): Payer: Medicare Other

## 2011-05-12 ENCOUNTER — Encounter (HOSPITAL_COMMUNITY)
Admission: RE | Admit: 2011-05-12 | Discharge: 2011-05-12 | Disposition: A | Discharge status: Home / Self Care | Payer: Medicare Other | Source: Ambulatory Visit | Attending: Gastroenterology | Admitting: Gastroenterology

## 2011-05-12 DIAGNOSIS — R1013 Epigastric pain: Secondary | ICD-10-CM | POA: Insufficient documentation

## 2011-05-12 MED ORDER — TECHNETIUM TC 99M MEBROFENIN IV KIT
5.0000 | PACK | Freq: Once | INTRAVENOUS | Status: AC | PRN
  Administered 2011-05-12: 5 via INTRAVENOUS

## 2011-05-23 IMAGING — CR DG CHEST 2V
2 series · 2 of 2 positions shown · non-contrast
Comparison: 02/03/2008

CLINICAL DATA: Preop

CHEST - 2 VIEW

[view not recorded (1 of 2)]
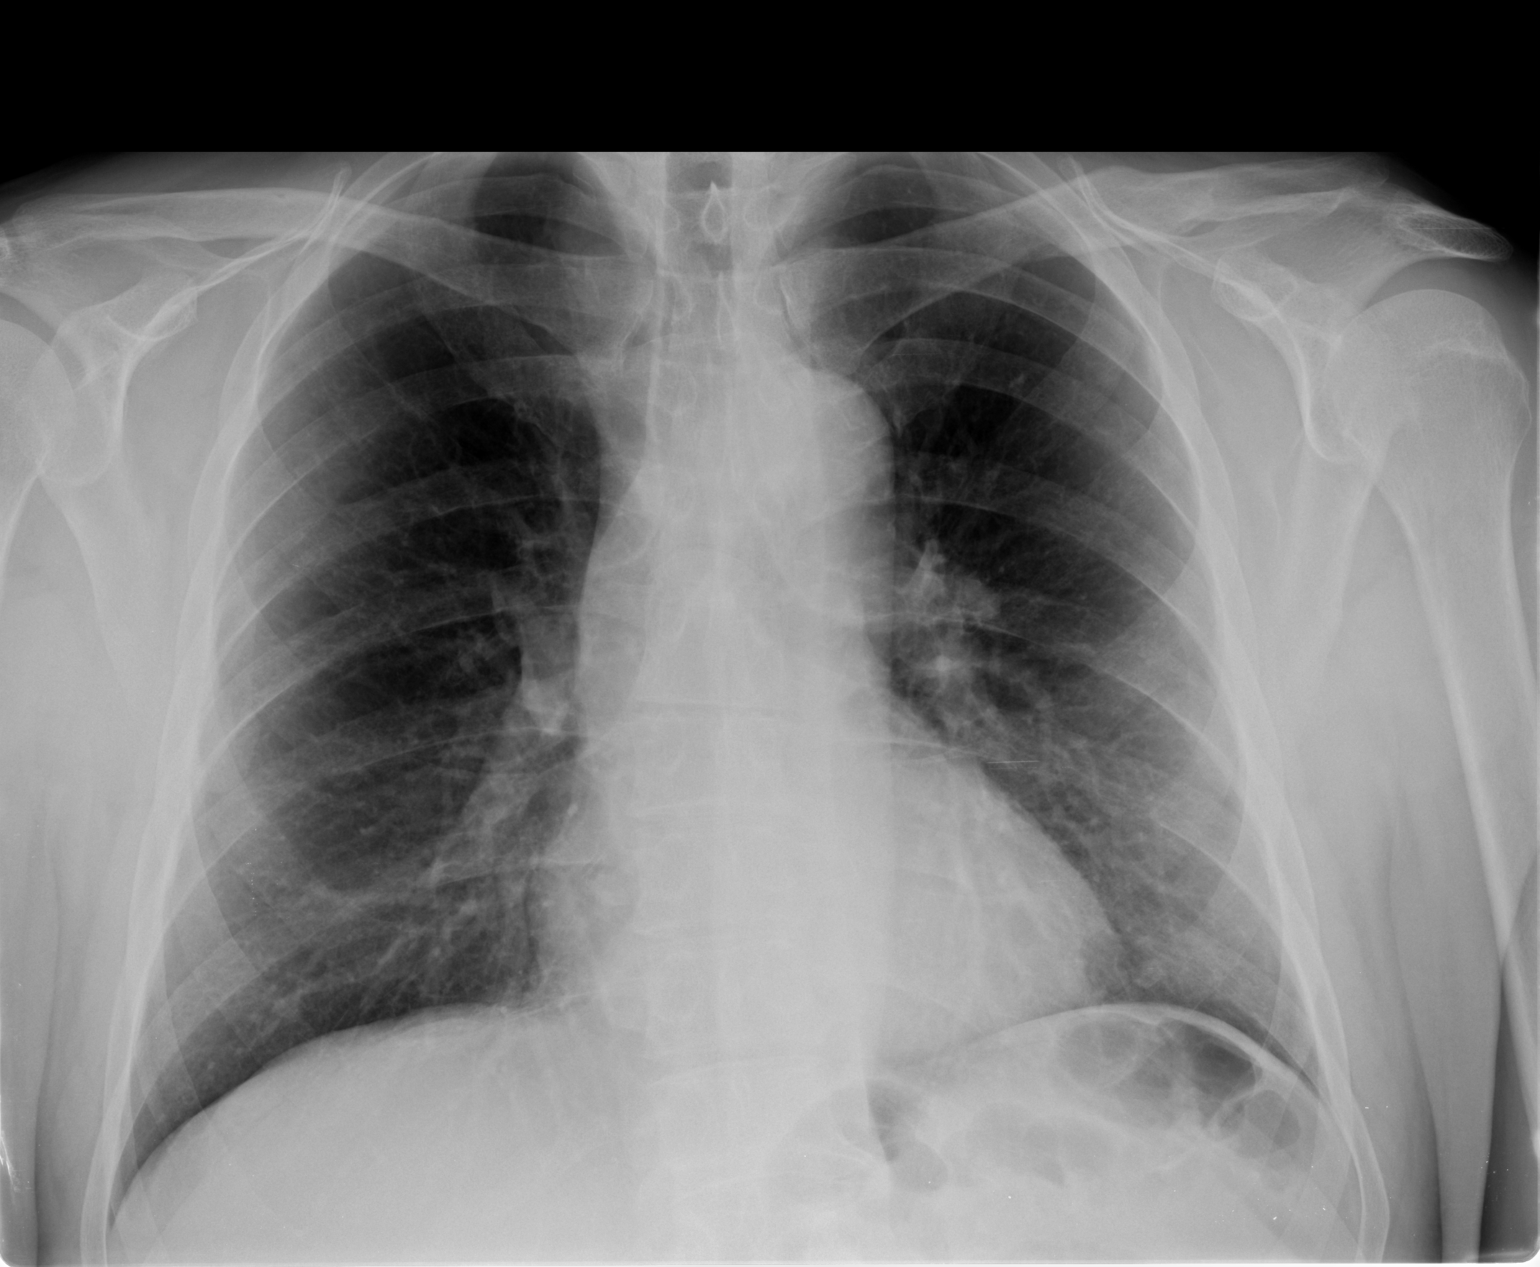

[view not recorded (2 of 2)]
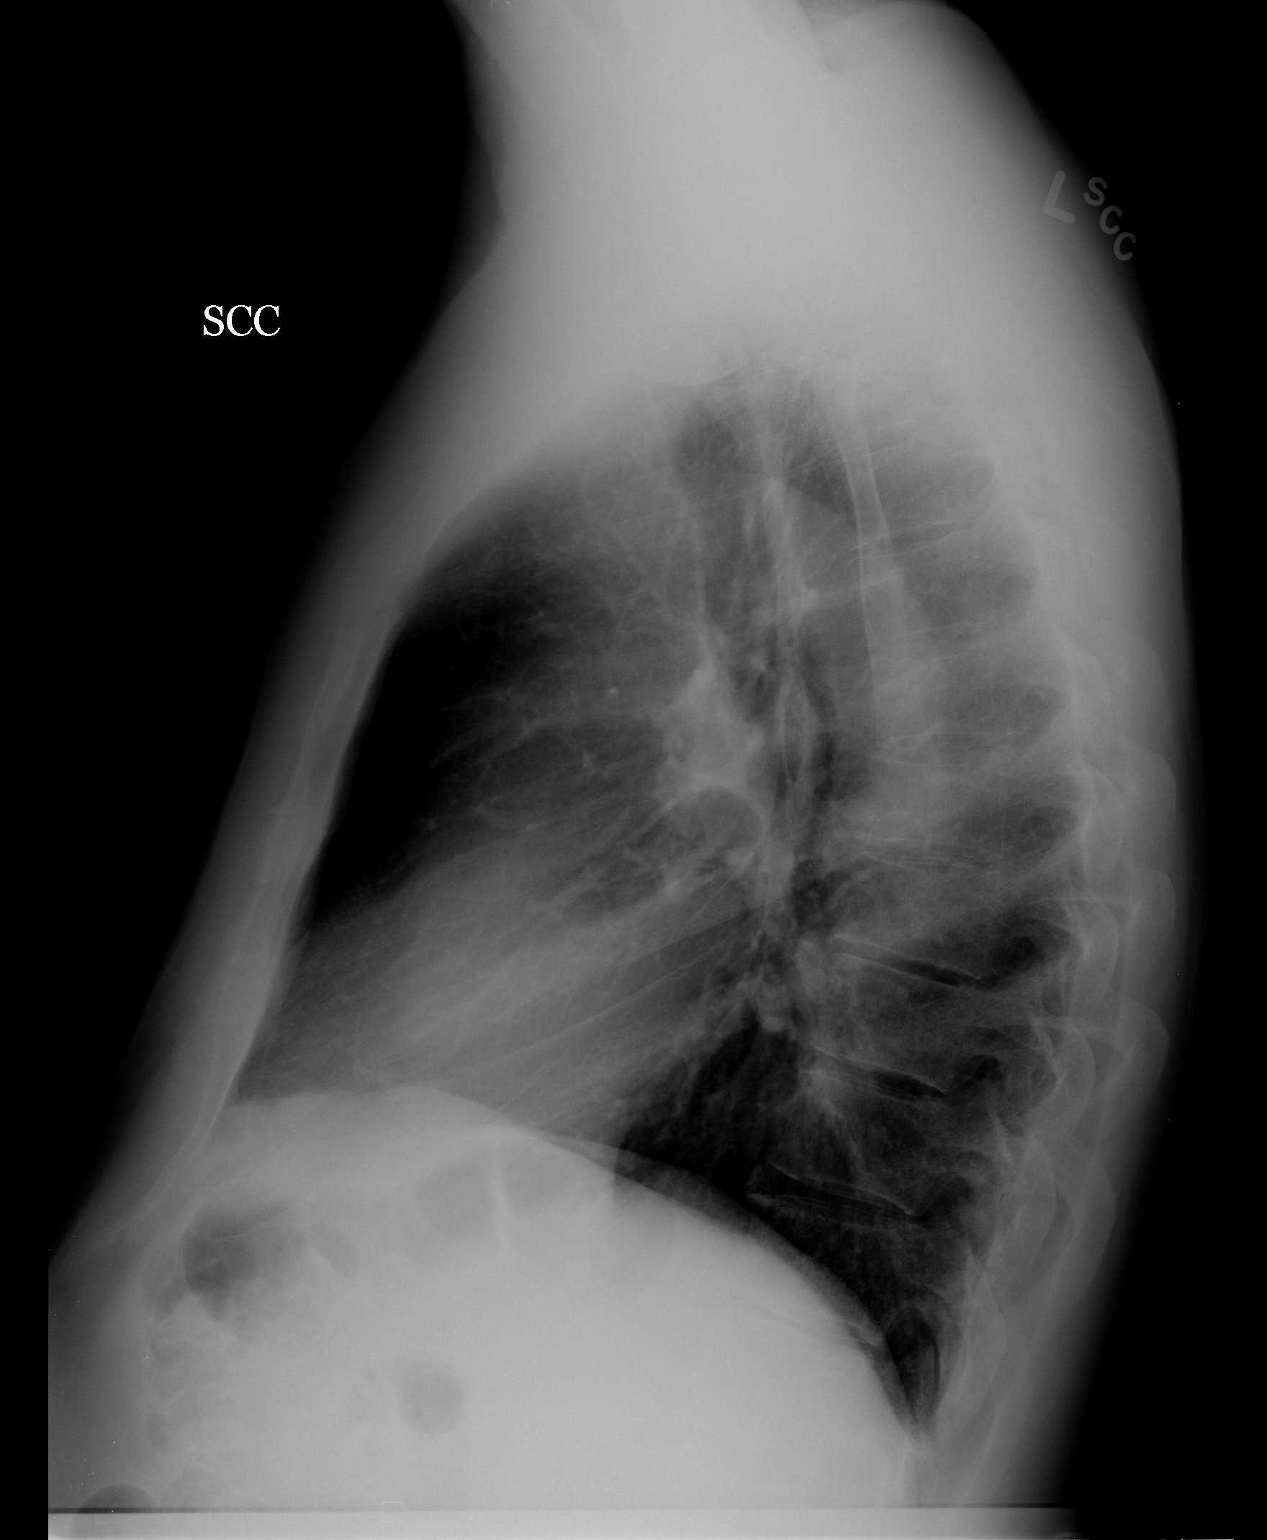

[2 of 2 positions shown; findings below may reference images not displayed]

FINDINGS: Cardiomediastinal silhouette is unremarkable.  No acute
infiltrate or pleural effusion.  No pulmonary edema.  Bony thorax
is stable.
IMPRESSION: No active disease.  No significant change.

## 2011-05-24 IMAGING — RF DG CERVICAL SPINE 1V
1 series · 1 of 1 positions shown · non-contrast
Comparison: [DATE]

CLINICAL DATA: ACDF

CERVICAL SPINE - 1 VIEW

[Series 1: run · 1 of 1 slices shown]
[im 1/1]
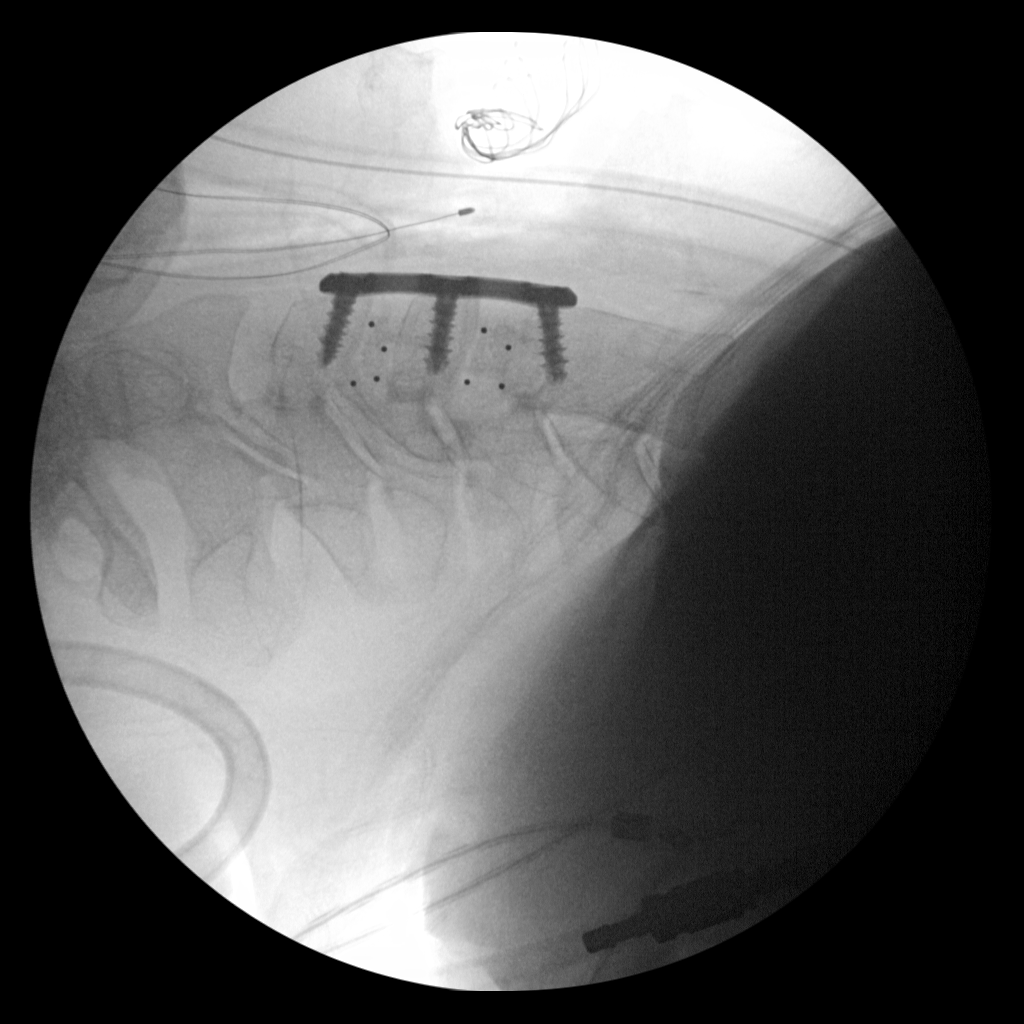

[1 of 1 positions shown; findings below may reference images not displayed]

FINDINGS: Previous fusion at C5-6.  Anterior cervical discectomy
and fusion today from C3-C5.  Interbody fusion material in place.
Anterior plate with screw fixation.
IMPRESSION: ACDF C3-C5.  Old fusion C5-6.

## 2011-06-12 ENCOUNTER — Telehealth: Payer: Self-pay | Admitting: Cardiology

## 2011-06-12 NOTE — Telephone Encounter (Signed)
Every time he goes to a doctor his blood pressure is running high.  Does he need to increase his medication/be seen/ etc.  Please call him back and advise what he should do. Pt aware that Juliette Alcide is off today.

## 2011-06-12 NOTE — Telephone Encounter (Signed)
SPOKE WITH PT  IN GREAT LENGTH  HAS CONCERNS RE B/P  FEELS LIKE IS RUNNING HIGH ALSO  IS COMPLAINING OF GOUT FLARE UP   HAS CONCERNS MAY BE A RESULT OF BEING ON HCTZ  WOULD LIKE TO F/U WITH DR BRICKBILL PT AWARE WILL FORWARD TO [facility] AND DR Patty Sermons FOR REVIEW .Zack Seal

## 2011-06-13 NOTE — Telephone Encounter (Signed)
I agree with the addition of losartan

## 2011-06-13 NOTE — Telephone Encounter (Signed)
Pt returning call to [place]. Please call back to discuss.

## 2011-06-13 NOTE — Telephone Encounter (Signed)
Left message

## 2011-06-13 NOTE — Telephone Encounter (Signed)
Consider work in office visit with me or Lawson Fiscal to see where he stands with his blood pressure.

## 2011-06-13 NOTE — Telephone Encounter (Signed)
Losartan on allergies and thinks it caused a cough

## 2011-06-13 NOTE — Telephone Encounter (Signed)
Saw Dr Duanne Guess this afternoon for gout.  Discontinued HCTZ and recommended starting Losartan daily (thinks 50 mg).  Do you agree with this?  Has been running high at MD's office and at home.  Did Rx colchicine.

## 2011-06-14 NOTE — Telephone Encounter (Signed)
Gathered samples for patient

## 2011-06-14 NOTE — Telephone Encounter (Signed)
Received chart and the Losartan caused nausea and dizziness, not a cough. Please advise

## 2011-06-14 NOTE — Telephone Encounter (Signed)
Instead of losartan use Micardis 40 mg 1 daily

## 2011-06-14 NOTE — Telephone Encounter (Signed)
Advised patient

## 2011-06-23 ENCOUNTER — Telehealth: Payer: Self-pay | Admitting: Cardiology

## 2011-06-23 NOTE — Telephone Encounter (Signed)
Brian Villegas states the Micardis is making him feel anxious and has a dry cough.  He would like to try something else.  He is wondering about doubling the Diltiazem he was on.  He knows that Dr Patty Sermons is out of the office today and is ok waiting until next week

## 2011-06-23 NOTE — Telephone Encounter (Signed)
Pt calling stating that micardis wasn't working very well and they might need to try another one. Please return pt call to discuss further.

## 2011-06-26 ENCOUNTER — Telehealth: Payer: Self-pay | Admitting: Cardiology

## 2011-06-26 NOTE — Telephone Encounter (Signed)
Left message

## 2011-06-26 NOTE — Telephone Encounter (Signed)
Advised patient of change

## 2011-06-26 NOTE — Telephone Encounter (Signed)
May take BID

## 2011-06-26 NOTE — Telephone Encounter (Signed)
Pt returning your call

## 2011-06-26 NOTE — Telephone Encounter (Signed)
Attached to message from 10/19 with information in it

## 2011-06-26 NOTE — Telephone Encounter (Signed)
Pt rtn call he will be leaving home so please call cell number

## 2011-06-26 NOTE — Telephone Encounter (Signed)
Patient states he is only taking the Diltiazem 120 mg once daily and heart rate around 75.  Would it be ok to increase to two a day, or something else?  Has had a lot of intolerance to blood pressure medications in past

## 2011-07-02 NOTE — Telephone Encounter (Signed)
Agree with increase in dosage of diltiazem to twice a day

## 2011-12-28 ENCOUNTER — Other Ambulatory Visit: Payer: Self-pay | Admitting: Physical Medicine and Rehabilitation

## 2011-12-28 DIAGNOSIS — M542 Cervicalgia: Secondary | ICD-10-CM

## 2012-01-01 ENCOUNTER — Ambulatory Visit
Admission: RE | Admit: 2012-01-01 | Discharge: 2012-01-01 | Disposition: A | Discharge status: Home / Self Care | Payer: Medicare Other | Source: Ambulatory Visit | Attending: Physical Medicine and Rehabilitation | Admitting: Physical Medicine and Rehabilitation

## 2012-01-01 DIAGNOSIS — M542 Cervicalgia: Secondary | ICD-10-CM

## 2012-01-01 MED ORDER — GADOBENATE DIMEGLUMINE 529 MG/ML IV SOLN
19.0000 mL | Freq: Once | INTRAVENOUS | Status: AC | PRN
  Administered 2012-01-01: 19 mL via INTRAVENOUS

## 2012-06-26 ENCOUNTER — Ambulatory Visit (INDEPENDENT_AMBULATORY_CARE_PROVIDER_SITE_OTHER): Payer: Medicare Other | Admitting: Cardiology

## 2012-06-26 ENCOUNTER — Encounter: Payer: Self-pay | Admitting: Cardiology

## 2012-06-26 VITALS — BP 130/92 | HR 92 | Ht 74.0 in | Wt 207.0 lb

## 2012-06-26 DIAGNOSIS — I119 Hypertensive heart disease without heart failure: Secondary | ICD-10-CM

## 2012-06-26 MED ORDER — DILTIAZEM HCL ER COATED BEADS 240 MG PO CP24: ORAL_CAPSULE | ORAL | Status: DC

## 2012-06-26 NOTE — Assessment & Plan Note (Signed)
He indicates that he has had a lot of blood work from his other physicians.  No cause for his malaise and fatigue has been found.  We discussed what his diagnosis might be.  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 I have recommended that he start going back to the gym and starting back into a very gradual workup to improve his muscular strength and stamina

## 2012-06-26 NOTE — Patient Instructions (Signed)
Increase you Diltiazem CD to 240 mg daily, Rx sent to [facility]  Your physician wants you to follow-up in: 6 MONTHS You will receive a reminder letter in the mail two months in advance. If you don't receive a letter, please call our office to schedule the follow-up appointment.

## 2012-06-26 NOTE — Assessment & Plan Note (Signed)
He is intolerant to most of the blood pressure medications that have been tried in the past.  He is able to take diltiazem and we will increase the dose from 180 up to 240 mg daily and observe response

## 2012-06-26 NOTE — Progress Notes (Signed)
Brian Villegas Date of Birth:  Nov 10, 1945 [facility] [address] [place], [place]  [address] [phone]         Fax   [phone]  History of Present Illness: This pleasant 66 year old gentleman is seen for a  followup office visit. He has a history of essential hypertension. He is intolerant to many medications he has been tolerating his present medications which are hydrochlorothiazide 25 mg daily and diltiazem CD 180 milligrams daily.  The patient denies any chest pain or palpitations. He had an echocardiogram on 12/04/06 which showed normal LV systolic function and a pseudo-normal left ventricular filling pattern. There is trivial aortic valvular regurgitation. The patient had a nuclear stress test on 09/01/10 which showed no ischemia and which showed an ejection fraction of 49% and a hypertensive blood pressure response to exercise. Since last visit the patient has been seeing a hematologist in [place] who has diagnosed an elevated hematocrit and has been performing phlebotomies at four-month intervals.  His hematocrit is now ranging between 45 and 50 patient has been experiencing increased weakness and lack of stamina he does not get any regular exercise.  He is still drinking alcohol in the evening although he states that he has cut down.  In the past we have urged him to cut his alcohol intake weight down and see if that won't help his overall symptomatology   Current Outpatient Prescriptions  Medication Sig Dispense Refill  . allopurinol (ZYLOPRIM) 100 MG tablet as directed.       [name] ALPRAZolam (XANAX) 0.5 MG tablet Take 1 tablet by mouth as needed.      . ANDROGEL PUMP 20.25 MG/ACT (1.62%) GEL       . cyanocobalamin (,VITAMIN B-12,) 1000 MCG/ML injection every 30 (thirty) days.       [name] diltiazem (CARDIZEM CD) 240 MG 24 hr capsule 1 DAILY  30 capsule  11  . pantoprazole (PROTONIX) 40 MG tablet Take 1 tablet by mouth Daily.      [name] zolpidem (AMBIEN) 10 MG  tablet Take 10 mg by mouth at bedtime as needed.        [name] DISCONTD: diltiazem (CARDIZEM CD) 120 MG 24 hr capsule Take 1 capsule (120 mg total) by mouth 2 (two) times daily.  60 capsule  5  . DISCONTD: diltiazem (CARDIZEM CD) 120 MG 24 hr capsule Take 180 mg by mouth 2 (two) times daily.        Allergies  Allergen Reactions  . Amlodipine   . Bystolic (Nebivolol Hcl)   . Codeine   . Lisinopril   . Losartan     Nausea and dizziness  . Micardis Hct (Telmisartan-Hctz)     Cough, anxious  . Toprol Xl (Metoprolol Succinate)   . Triamterene     Patient Active Problem List  Diagnosis  . Benign hypertensive heart disease without heart failure  . Malaise and fatigue    History  Smoking status  . Never Smoker   Smokeless tobacco  . Not on file    History  Alcohol Use: Not on file    No family history on file.  Review of Systems: Constitutional: no fever chills diaphoresis or fatigue or change in weight.  Head and neck: no hearing loss, no epistaxis, no photophobia or visual disturbance. Respiratory: No cough, shortness of breath or wheezing. Cardiovascular: No chest pain peripheral edema, palpitations. Gastrointestinal: No abdominal distention, no abdominal pain, no change in bowel habits hematochezia or melena. Genitourinary: No dysuria,  no frequency, no urgency, no nocturia. Musculoskeletal:No arthralgias, no back pain, no gait disturbance or myalgias. Neurological: No dizziness, no headaches, no numbness, no seizures, no syncope, no weakness, no tremors. Hematologic: No lymphadenopathy, no easy bruising. Psychiatric: No confusion, no hallucinations, no sleep disturbance.    Physical Exam: Filed Vitals:   06/26/12 1125  BP: 130/92  Pulse: 92   the general appearance reveals a slightly plethoric gentleman in no acute distress.The head and neck exam reveals pupils equal and reactive.  Extraocular movements are full.  There is no scleral icterus.  The mouth and pharynx are  normal.  The neck is supple.  The carotids reveal no bruits.  The jugular venous pressure is normal.  The  thyroid is not enlarged.  There is no lymphadenopathy.  The chest is clear to percussion and auscultation.  There are no rales or rhonchi.  Expansion of the chest is symmetrical.  The precordium is quiet.  The first heart sound is normal.  The second heart sound is physiologically split.  There is no murmur gallop rub or click.  There is no abnormal lift or heave.  The abdomen is soft and nontender.  The bowel sounds are normal.  The liver and spleen are not enlarged.  There are no abdominal masses.  There are no abdominal bruits.  Extremities reveal good pedal pulses.  There is no phlebitis or edema.  There is no cyanosis or clubbing.  Strength is normal and symmetrical in all extremities.  There is no lateralizing weakness.  There are no sensory deficits.  The skin is warm and dry.  There is no rash.     Assessment / Plan: Advice to cut way back or abstain altogether from alcohol.  Get back into a regular exercise program.  Increase the diltiazem up to 240 mg daily.  Recheck 6 months

## 2012-09-24 ENCOUNTER — Telehealth: Payer: Self-pay | Admitting: Cardiology

## 2012-09-24 NOTE — Telephone Encounter (Signed)
Pt is experienceing b/p issues as high as 220/128 and that was Friday and he wants to be seen ASAP

## 2012-09-24 NOTE — Telephone Encounter (Signed)
Appointment scheduled for patient tomorrow with Dawayne Patricia NP  Patient did go to ED [facility] Sharper) with blood pressure last week

## 2012-09-25 ENCOUNTER — Encounter: Payer: Self-pay | Admitting: Nurse Practitioner

## 2012-09-25 ENCOUNTER — Ambulatory Visit (INDEPENDENT_AMBULATORY_CARE_PROVIDER_SITE_OTHER): Payer: Medicare Other | Admitting: Nurse Practitioner

## 2012-09-25 VITALS — BP 150/90 | HR 80 | Ht 74.0 in | Wt 212.5 lb

## 2012-09-25 DIAGNOSIS — I10 Essential (primary) hypertension: Secondary | ICD-10-CM

## 2012-09-25 MED ORDER — DILTIAZEM HCL ER COATED BEADS 120 MG PO CP24: 120.0000 mg | ORAL_CAPSULE | Freq: Two times a day (BID) | ORAL | Status: DC

## 2012-09-25 NOTE — Progress Notes (Signed)
Brian Villegas Date of Birth: 1946/04/12 Medical Record #[id number]  History of Present Illness: [name] is seen today for a work in visit. He is seen for Dr. Patty Sermons. He has no known CAD. Had a nuclear stress test in 2011 showing no ischemia and EF of 49% with hypertensive BP response. Does have HTN. Intolerant to many medicines. Has polycythemia and requires phlebotomies per his hematologist in [place]. He has been instructed in the past to cut back his alcohol use. Other issues include gout, GERD and low testosterone levels.   He comes in today. He is here alone. He called Brian Villegas yesterday to report issues with his BP. Was feeling pretty fatigued and lethargic. Went to the ER in [place]. BP was over 200 systolic. Had several tests done - we do not have the records. May have gotten some Clonidine but his medicines were not changed. He is back on the 180 mg of Cardizem. Felt lightheaded on the 240 mg dose. Takes his medicines in the evening. Continues to drink alcohol. Hard to get him to quantify. Feels like he is having more "cognitive impairment with less stamina". Not really exercising. Continues to see his hematologist and has periodic phlebotomies. No exact etiology has apparently been found. He does endorse salt use and really does not limit his salt use.   Current Outpatient Prescriptions on File Prior to Visit  Medication Sig Dispense Refill  . allopurinol (ZYLOPRIM) 100 MG tablet as directed.       [name] ALPRAZolam (XANAX) 0.5 MG tablet Take 1 tablet by mouth as needed.      . ANDROGEL PUMP 20.25 MG/ACT (1.62%) GEL       . cyanocobalamin (,VITAMIN B-12,) 1000 MCG/ML injection every 30 (thirty) days.       . pantoprazole (PROTONIX) 40 MG tablet Take 1 tablet by mouth Daily.      [name] zolpidem (AMBIEN) 10 MG tablet Take 10 mg by mouth at bedtime as needed.          Allergies  Allergen Reactions  . Amlodipine   . Bystolic (Nebivolol Hcl)   . Codeine   . Lisinopril   .  Losartan     Nausea and dizziness  . Micardis Hct (Telmisartan-Hctz)     Cough, anxious  . Toprol Xl (Metoprolol Succinate)   . Triamterene     Past Medical History  Diagnosis Date  . Hypertension     multiple medication intolerances  . Polycythemia     requires phlebotomies   . Alcohol consuption of more than two drinks per day     Past Surgical History  Procedure Date  . US echocardiography 2008    EF 50-55% , trivial AR,hypokinesis of mid inferior wall  . Nuclear stress test 08/2010    EF 49%, Normal  . Cardiac catheterization 2008  . Cervical discectomy 2010  . Appendectomy   . Left shoulder surgery     History  Smoking status  . Never Smoker   Smokeless tobacco  . Not on file    History  Alcohol Use  . Yes    History reviewed. No pertinent family history.  Review of Systems: The review of systems is per the HPI.  All other systems were reviewed and are negative.  Physical Exam: BP 150/90  Pulse 80  Ht 6\' 2"  (1.88 m)  Wt 212 lb 8 oz (96.389 kg)  BMI 27.28 kg/m2 Patient is alert and in no acute distress. Affect seems a  little flat to me. Skin is warm and dry. Color is normal.  HEENT is unremarkable. Normocephalic/atraumatic. PERRL. Sclera are nonicteric. Neck is supple. No masses. No JVD. Lungs are clear. Cardiac exam shows a regular rate and rhythm. Abdomen is soft. Extremities are without edema. Gait and ROM are intact. No gross neurologic deficits noted.  LABORATORY DATA:  Lab Results  Component Value Date   WBC 4.7 05/17/2009   HGB 19.0* 05/17/2009   HCT 54.9* 05/17/2009   PLT 219 05/17/2009   GLUCOSE 106* 05/17/2009   NA 140 05/17/2009   K 5.4* 05/17/2009   CL 105 05/17/2009   CREATININE 1.04 05/17/2009   BUN 10 05/17/2009   CO2 28 05/17/2009   INR 1.1 05/18/2009    Assessment / Plan: 1. HTN - His cuff is 20 points higher (169/101 today with our BP of 150/90). He is agreeable to trying Cardizem 120 mg BID. He is intolerant to numerous issues and I  have tried to stress to him the importance of better lifestyle choices (cut back the alcohol and the salt use). Will arrange for echocardiogram. See Dr. Patty Sermons back in 3 weeks for further discussion.   2. Polycythemia - followed by hematology.  Patient is agreeable to this plan and will call if any problems develop in the interim.

## 2012-09-25 NOTE — Patient Instructions (Addendum)
Let's try Diltiazem 120 mg two times a day - this is at the drug store  Cut back the salt  Cut back the alcohol  See Dr. Patty Sermons in a 3 weeks  We are going to schedule an ultrasound of your heart  Call the [facility] office at [phone] if you have any questions, problems or concerns.

## 2012-10-01 ENCOUNTER — Other Ambulatory Visit (HOSPITAL_COMMUNITY): Payer: Medicare Other

## 2012-10-16 ENCOUNTER — Ambulatory Visit: Payer: Medicare Other | Admitting: Cardiology

## 2012-10-23 ENCOUNTER — Ambulatory Visit (HOSPITAL_COMMUNITY): Payer: Medicare Other | Attending: Cardiology | Admitting: Radiology

## 2012-10-23 ENCOUNTER — Other Ambulatory Visit: Payer: Self-pay

## 2012-10-23 ENCOUNTER — Other Ambulatory Visit (HOSPITAL_COMMUNITY): Payer: Medicare Other

## 2012-10-23 DIAGNOSIS — I517 Cardiomegaly: Secondary | ICD-10-CM

## 2012-10-23 DIAGNOSIS — D45 Polycythemia vera: Secondary | ICD-10-CM | POA: Insufficient documentation

## 2012-10-23 DIAGNOSIS — I10 Essential (primary) hypertension: Secondary | ICD-10-CM | POA: Insufficient documentation

## 2012-10-23 NOTE — Progress Notes (Signed)
Echocardiogram performed.  

## 2012-10-25 ENCOUNTER — Encounter: Payer: Self-pay | Admitting: Cardiology

## 2012-10-25 ENCOUNTER — Ambulatory Visit (INDEPENDENT_AMBULATORY_CARE_PROVIDER_SITE_OTHER): Payer: Medicare Other | Admitting: Cardiology

## 2012-10-25 VITALS — BP 142/96 | HR 75 | Ht 76.0 in | Wt 209.0 lb

## 2012-10-25 DIAGNOSIS — D45 Polycythemia vera: Secondary | ICD-10-CM

## 2012-10-25 DIAGNOSIS — D751 Secondary polycythemia: Secondary | ICD-10-CM

## 2012-10-25 DIAGNOSIS — I119 Hypertensive heart disease without heart failure: Secondary | ICD-10-CM

## 2012-10-25 MED ORDER — LOSARTAN POTASSIUM 25 MG PO TABS: 25.0000 mg | ORAL_TABLET | Freq: Every day | ORAL | Status: DC

## 2012-10-25 NOTE — Assessment & Plan Note (Signed)
The patient and does not feel that the current blood pressure regimen is satisfactory.  He checks his blood pressure periodically at home and it is tending to run high at home.  He feels that the diltiazem CD 120 twice a day to be causing him to have increased fatigue.  We will decrease his diltiazem to just once a day in the evening and we are going to try losartan 25 mg once in the morning.  Remotely he thought that in the past losartan may have caused some nausea but now thinks it may have been coincidental and is willing to try it again.  We will start out at the lowest dose of 25 mg. For his blood pressure he also needs to be very careful about low-salt diet and to try to get more aerobic exercise.

## 2012-10-25 NOTE — Patient Instructions (Signed)
START LOSARTAN 25 MG DAILY  DECREASE YOUR CARDIZEM CD 120 MG TO ONCE A DAY IN THE EVENING  WORK HARDER ON EXERCISE  Your physician recommends that you schedule a follow-up appointment in: 2 month ov/bmet  Sodium-Controlled Diet Sodium is a mineral. It is found in many foods. Sodium may be found naturally or added during the making of a food. The most common form of sodium is salt, which is made up of sodium and chloride. Reducing your sodium intake involves changing your eating habits. The following guidelines will help you reduce the sodium in your diet:  Stop using the salt shaker.  Use salt sparingly in cooking and baking.  Substitute with sodium-free seasonings and spices.  Do not use a salt substitute (potassium chloride) without your caregiver's permission.  Include a variety of fresh, unprocessed foods in your diet.  Limit the use of processed and convenience foods that are high in sodium. USE THE FOLLOWING FOODS SPARINGLY: Breads/Starches  Commercial bread stuffing, commercial pancake or waffle mixes, coating mixes. Waffles. Croutons. Prepared (boxed or frozen) potato, rice, or noodle mixes that contain salt or sodium. Salted [place] fries or hash browns. Salted popcorn, breads, crackers, chips, or snack foods. Vegetables  Vegetables canned with salt or prepared in cream, butter, or cheese sauces. Sauerkraut. Tomato or vegetable juices canned with salt.  Fresh vegetables are allowed if rinsed thoroughly. Fruit  Fruit is okay to eat. Meat and Meat Substitutes  Salted or smoked meats, such as bacon or Canadian bacon, chipped or corned beef, hot dogs, salt pork, luncheon meats, pastrami, ham, or sausage. Canned or smoked fish, poultry, or meat. Processed cheese or cheese spreads, blue or Roquefort cheese. Battered or frozen fish products. Prepared spaghetti sauce. Baked beans. Reuben sandwiches. Salted nuts. Caviar. Milk  Limit buttermilk to 1 cup per week. Soups and  Combination Foods  Bouillon cubes, canned or dried soups, broth, consomm. Convenience (frozen or packaged) dinners with more than 600 mg sodium. Pot pies, pizza, Asian food, fast food cheeseburgers, and specialty sandwiches. Desserts and Sweets  Regular (salted) desserts, pie, commercial fruit snack pies, commercial snack cakes, canned puddings.  Eat desserts and sweets in moderation. Fats and Oils  Gravy mixes or canned gravy. No more than 1 to 2 tbs of salad dressing. Chip dips.  Eat fats and oils in moderation. Beverages  See those listed under the vegetables and milk groups. Condiments  Ketchup, mustard, meat sauces, salsa, regular (salted) and lite soy sauce or mustard. Dill pickles, olives, meat tenderizer. Prepared horseradish or pickle relish. Dutch-processed cocoa. Baking powder or baking soda used medicinally. Worcestershire sauce. "Light" salt. Salt substitute, unless approved by your caregiver. Document Released: 02/10/2002 Document Revised: 11/13/2011 Document Reviewed: 09/13/2009 [facility] Patient Information 2013 [place], [place].

## 2012-10-25 NOTE — Assessment & Plan Note (Signed)
Some of his malaise and fatigue may also be related to his polycythemia and if so should improve after he has some additional phlebotomies.  He is followed for this by a hematologist in [place].  He is aiming for a hematocrit of about 45

## 2012-10-25 NOTE — Progress Notes (Signed)
Brian Villegas Date of Birth:  [birth year] [facility] [address] [place], [place]  [address] [phone]         Fax   [phone]  History of Present Illness: This pleasant 67 year old gentleman is seen for a followup office visit. He has a history of essential hypertension. He is intolerant to many medications he has been tolerating his present medications which is diltiazem CD 120 milligrams daily twice a day.  However the patient feels that this may be causing him to have fatigue and lack of energy. The patient denies any chest pain or palpitations. He had an echocardiogram on 12/04/06 which showed normal LV systolic function and a pseudo-normal left ventricular filling pattern. There is trivial aortic valvular regurgitation. The patient had a nuclear stress test on 09/01/10 which showed no ischemia and which showed an ejection fraction of 49% and a hypertensive blood pressure response to exercise.  More recently the patient had a followup echocardiogram on 10/24/11 which showed that his ejection fraction remains normal at 55-60% and he has grade 1 diastolic dysfunction .Since last visit the patient has been seeing a hematologist in [place] who has diagnosed an elevated hematocrit and has been performing phlebotomies at regular intervals. His hematocrit is now ranging between 45 and 50 patient has been experiencing increased weakness and lack of stamina he does not get any regular exercise. He is still drinking alcohol in the evening although he states that he has cut down. In the past we have urged him to cut his alcohol intake weight down and see if that won't help his overall symptomatology   Current Outpatient Prescriptions  Medication Sig Dispense Refill  . allopurinol (ZYLOPRIM) 100 MG tablet as directed.       [name] ALPRAZolam (XANAX) 0.5 MG tablet Take 1 tablet by mouth as needed.      . ANDROGEL PUMP 20.25 MG/ACT (1.62%) GEL       . cyanocobalamin (,VITAMIN  B-12,) 1000 MCG/ML injection every 30 (thirty) days.       [name] diltiazem (CARDIZEM CD) 120 MG 24 hr capsule Take 120 mg by mouth as directed. 1 daily in the evening      . pantoprazole (PROTONIX) 40 MG tablet Take 1 tablet by mouth Daily.      [name] zolpidem (AMBIEN) 10 MG tablet Take 10 mg by mouth at bedtime as needed.        [name] losartan (COZAAR) 25 MG tablet Take 1 tablet (25 mg total) by mouth daily.  90 tablet  3   No current facility-administered medications for this visit.    Allergies  Allergen Reactions  . Amlodipine   . Bystolic (Nebivolol Hcl)   . Codeine   . Lisinopril   . Micardis Hct (Telmisartan-Hctz)     Cough, anxious  . Toprol Xl (Metoprolol Succinate)   . Triamterene     Patient Active Problem List  Diagnosis  . Benign hypertensive heart disease without heart failure  . Malaise and fatigue    History  Smoking status  . Never Smoker   Smokeless tobacco  . Not on file    History  Alcohol Use  . Yes    No family history on file.  Review of Systems: Constitutional: no fever chills diaphoresis or fatigue or change in weight.  Head and neck: no hearing loss, no epistaxis, no photophobia or visual disturbance. Respiratory: No cough, shortness of breath or wheezing. Cardiovascular: No chest pain peripheral edema, palpitations. Gastrointestinal: No  abdominal distention, no abdominal pain, no change in bowel habits hematochezia or melena. Genitourinary: No dysuria, no frequency, no urgency, no nocturia. Musculoskeletal:No arthralgias, no back pain, no gait disturbance or myalgias. Neurological: No dizziness, no headaches, no numbness, no seizures, no syncope, no weakness, no tremors. Hematologic: No lymphadenopathy, no easy bruising. Psychiatric: No confusion, no hallucinations, no sleep disturbance.    Physical Exam: Filed Vitals:   10/25/12 1215  BP: 142/96  Pulse: 75   the general appearance reveals a well-developed well-nourished plethoric gentleman  in no distress.The head and neck exam reveals pupils equal and reactive.  Extraocular movements are full.  There is no scleral icterus.  The mouth and pharynx are normal.  The neck is supple.  The carotids reveal no bruits.  The jugular venous pressure is normal.  The  thyroid is not enlarged.  There is no lymphadenopathy.  The chest is clear to percussion and auscultation.  There are no rales or rhonchi.  Expansion of the chest is symmetrical.  The precordium is quiet.  The first heart sound is normal.  The second heart sound is physiologically split.  There is no murmur gallop rub or click.  There is no abnormal lift or heave.  The abdomen is soft and nontender.  The bowel sounds are normal.  The liver and spleen are not enlarged.  There are no abdominal masses.  There are no abdominal bruits.  Extremities reveal good pedal pulses.  There is no phlebitis or edema.  There is no cyanosis or clubbing.  Strength is normal and symmetrical in all extremities.  There is no lateralizing weakness.  There are no sensory deficits.  The skin is warm and dry.  There is no rash.     Assessment / Plan: Decrease diltiazem 2 once a day in the evening and resume losartan 25 mg in the morning and continue with low salt.  Recheck in 2 months for followup office visit and basal metabolic panel

## 2012-10-30 ENCOUNTER — Ambulatory Visit: Payer: Medicare Other | Admitting: Family

## 2012-12-12 ENCOUNTER — Other Ambulatory Visit: Payer: Self-pay | Admitting: Gastroenterology

## 2012-12-12 DIAGNOSIS — R1310 Dysphagia, unspecified: Secondary | ICD-10-CM

## 2012-12-16 ENCOUNTER — Other Ambulatory Visit: Payer: Medicare Other

## 2012-12-20 ENCOUNTER — Other Ambulatory Visit: Payer: Medicare Other

## 2012-12-23 ENCOUNTER — Ambulatory Visit: Payer: Medicare Other | Admitting: Cardiology

## 2013-01-03 ENCOUNTER — Encounter: Payer: Self-pay | Admitting: Cardiology

## 2013-01-03 ENCOUNTER — Ambulatory Visit (INDEPENDENT_AMBULATORY_CARE_PROVIDER_SITE_OTHER): Payer: Medicare Other | Admitting: Cardiology

## 2013-01-03 VITALS — BP 122/78 | HR 75 | Ht 74.0 in | Wt 214.8 lb

## 2013-01-03 DIAGNOSIS — I119 Hypertensive heart disease without heart failure: Secondary | ICD-10-CM

## 2013-01-03 NOTE — Patient Instructions (Addendum)
Your physician recommends that you continue on your current medications as directed. Please refer to the Current Medication list given to you today.  Your physician recommends that you schedule a follow-up appointment in: 3 month ov 

## 2013-01-03 NOTE — Assessment & Plan Note (Signed)
Blood pressure is remaining stable on current therapy.  Is not having any cough or other side effects.  He has not been exercising as much and his weight is up 5 pounds and I have encouraged him to increase his aerobic activity

## 2013-01-03 NOTE — Progress Notes (Signed)
Brian Villegas Date of Birth:  31-Aug-1946 [facility] [address] [place], [place]  [address] [phone]         Fax   [phone]  History of Present Illness: This pleasant 67 year old gentleman is seen for a followup office visit. He has a history of essential hypertension. [name] However the patient feels that this may be causing him to have fatigue and lack of energy. The patient denies any chest pain or palpitations. He had an echocardiogram on 12/04/06 which showed normal LV systolic function and a pseudo-normal left ventricular filling pattern. There is trivial aortic valvular regurgitation. The patient had a nuclear stress test on 09/01/10 which showed no ischemia and which showed an ejection fraction of 49% and a hypertensive blood pressure response to exercise. More recently the patient had a followup echocardiogram on 10/24/11 which showed that his ejection fraction remains normal at 55-60% and he has grade 1 diastolic dysfunction at his last office visit 2 months ago we reduced his diltiazem to once a day and added losartan 25 mg daily.  He has felt well on this new regimen and his blood pressure has been more satisfactory.  Current Outpatient Prescriptions  Medication Sig Dispense Refill  . allopurinol (ZYLOPRIM) 100 MG tablet as directed.       [name] ALPRAZolam (XANAX) 0.5 MG tablet Take 1 tablet by mouth as needed.      . ANDROGEL PUMP 20.25 MG/ACT (1.62%) GEL       . cyanocobalamin (,VITAMIN B-12,) 1000 MCG/ML injection every 30 (thirty) days.       [name] diltiazem (CARDIZEM CD) 120 MG 24 hr capsule Take 120 mg by mouth as directed. 1 daily in the evening      . losartan (COZAAR) 25 MG tablet Take 1 tablet (25 mg total) by mouth daily.  90 tablet  3  . pantoprazole (PROTONIX) 40 MG tablet Take 1 tablet by mouth Daily.      [name] zolpidem (AMBIEN) 10 MG tablet Take 10 mg by mouth at bedtime as needed.         No current facility-administered medications for this visit.     Allergies  Allergen Reactions  . Amlodipine   . Bystolic (Nebivolol Hcl)   . Codeine   . Lisinopril   . Micardis Hct (Telmisartan-Hctz)     Cough, anxious  . Toprol Xl (Metoprolol Succinate)   . Triamterene     Patient Active Problem List   Diagnosis Date Noted  . Benign hypertensive heart disease without heart failure 01/31/2011  . Malaise and fatigue 01/31/2011    History  Smoking status  . Never Smoker   Smokeless tobacco  . Not on file    History  Alcohol Use  . Yes    No family history on file.  Review of Systems: Constitutional: no fever chills diaphoresis or fatigue or change in weight.  Head and neck: no hearing loss, no epistaxis, no photophobia or visual disturbance. Respiratory: No cough, shortness of breath or wheezing. Cardiovascular: No chest pain peripheral edema, palpitations. Gastrointestinal: No abdominal distention, no abdominal pain, no change in bowel habits hematochezia or melena. Genitourinary: No dysuria, no frequency, no urgency, no nocturia. Musculoskeletal:No arthralgias, no back pain, no gait disturbance or myalgias. Neurological: No dizziness, no headaches, no numbness, no seizures, no syncope, no weakness, no tremors. Hematologic: No lymphadenopathy, no easy bruising. Psychiatric: No confusion, no hallucinations, no sleep disturbance.    Physical Exam: Filed Vitals:   01/03/13 1505  BP: 122/78  Pulse: 75   the general appearance reveals a well-developed well-nourished gentleman in no distress.  The head and neck exam reveals pupils equal and reactive.  Extraocular movements are full.  There is no scleral icterus.  The mouth and pharynx are normal.  The neck is supple.  The carotids reveal no bruits.  The jugular venous pressure is normal.  The  thyroid is not enlarged.  There is no lymphadenopathy.  The chest is clear to percussion and auscultation.  There are no rales or rhonchi.  Expansion of the chest is symmetrical.  The  precordium is quiet.  The first heart sound is normal.  The second heart sound is physiologically split.  There is no murmur gallop rub or click.  There is no abnormal lift or heave.  The abdomen is soft and nontender.  The bowel sounds are normal.  The liver and spleen are not enlarged.  There are no abdominal masses.  There are no abdominal bruits.  Extremities reveal good pedal pulses.  There is no phlebitis or edema.  There is no cyanosis or clubbing.  Strength is normal and symmetrical in all extremities.  There is no lateralizing weakness.  There are no sensory deficits.  The skin is warm and dry.  There is no rash.  EKG shows normal sinus rhythm with left axis deviation and pattern of possible old inferior wall MI age undetermined and since 01/31/11, no significant change   Assessment / Plan: Continue same medication.  Increase aerobic exercise and lose weight.  Continue to cut down on alcohol.  Recheck 3 months for office visit

## 2013-01-03 NOTE — Assessment & Plan Note (Signed)
Patient complains of lack of energy.  This may be multifactorial.  Part of it relates to not getting regular exercise.  He also has a history of polycythemia and requires periodic phlebotomies by his physician in [place]

## 2013-01-13 ENCOUNTER — Ambulatory Visit
Admission: RE | Admit: 2013-01-13 | Discharge: 2013-01-13 | Disposition: A | Discharge status: Home / Self Care | Payer: Medicare Other | Source: Ambulatory Visit | Attending: Gastroenterology | Admitting: Gastroenterology

## 2013-01-13 DIAGNOSIS — R1310 Dysphagia, unspecified: Secondary | ICD-10-CM

## 2013-01-31 ENCOUNTER — Emergency Department (HOSPITAL_COMMUNITY): Payer: Medicare Other

## 2013-01-31 ENCOUNTER — Emergency Department (HOSPITAL_COMMUNITY)
Admission: EM | Admit: 2013-01-31 | Discharge: 2013-01-31 | Disposition: A | Discharge status: Home / Self Care | Payer: Medicare Other | Attending: Emergency Medicine | Admitting: Emergency Medicine

## 2013-01-31 ENCOUNTER — Encounter (HOSPITAL_COMMUNITY): Payer: Self-pay | Admitting: Emergency Medicine

## 2013-01-31 DIAGNOSIS — Y9289 Other specified places as the place of occurrence of the external cause: Secondary | ICD-10-CM | POA: Insufficient documentation

## 2013-01-31 DIAGNOSIS — Y9301 Activity, walking, marching and hiking: Secondary | ICD-10-CM | POA: Insufficient documentation

## 2013-01-31 DIAGNOSIS — R55 Syncope and collapse: Secondary | ICD-10-CM | POA: Insufficient documentation

## 2013-01-31 DIAGNOSIS — S022XXA Fracture of nasal bones, initial encounter for closed fracture: Secondary | ICD-10-CM | POA: Insufficient documentation

## 2013-01-31 DIAGNOSIS — R040 Epistaxis: Secondary | ICD-10-CM | POA: Insufficient documentation

## 2013-01-31 DIAGNOSIS — R296 Repeated falls: Secondary | ICD-10-CM | POA: Insufficient documentation

## 2013-01-31 DIAGNOSIS — S79929A Unspecified injury of unspecified thigh, initial encounter: Secondary | ICD-10-CM | POA: Insufficient documentation

## 2013-01-31 DIAGNOSIS — S79919A Unspecified injury of unspecified hip, initial encounter: Secondary | ICD-10-CM | POA: Insufficient documentation

## 2013-01-31 DIAGNOSIS — I10 Essential (primary) hypertension: Secondary | ICD-10-CM | POA: Insufficient documentation

## 2013-01-31 DIAGNOSIS — Z79899 Other long term (current) drug therapy: Secondary | ICD-10-CM | POA: Insufficient documentation

## 2013-01-31 DIAGNOSIS — Z9861 Coronary angioplasty status: Secondary | ICD-10-CM | POA: Insufficient documentation

## 2013-01-31 DIAGNOSIS — D45 Polycythemia vera: Secondary | ICD-10-CM | POA: Insufficient documentation

## 2013-01-31 LAB — CBC WITH DIFFERENTIAL/PLATELET
Basophils Absolute: 0 10*3/uL (ref 0.0–0.1)
Eosinophils Absolute: 0.1 10*3/uL (ref 0.0–0.7)
Eosinophils Relative: 1 % (ref 0–5)
HCT: 47.7 % (ref 39.0–52.0)
Lymphocytes Relative: 18 % (ref 12–46)
MCH: 31.7 pg (ref 26.0–34.0)
MCHC: 34.2 g/dL (ref 30.0–36.0)
MCV: 92.6 fL (ref 78.0–100.0)
Monocytes Absolute: 1 10*3/uL (ref 0.1–1.0)
RDW: 15.6 % — ABNORMAL HIGH (ref 11.5–15.5)
WBC: 11.5 10*3/uL — ABNORMAL HIGH (ref 4.0–10.5)

## 2013-01-31 LAB — TROPONIN I: Troponin I: <0.3 ng/mL (ref <0.30)

## 2013-01-31 LAB — URINALYSIS, ROUTINE W REFLEX MICROSCOPIC
Bilirubin Urine: NEGATIVE
Glucose, UA: NEGATIVE mg/dL
Ketones, ur: 15 mg/dL — AB
Nitrite: NEGATIVE
pH: 5.5 (ref 5.0–8.0)

## 2013-01-31 LAB — BASIC METABOLIC PANEL
CO2: 22 mEq/L (ref 19–32)
Calcium: 9.6 mg/dL (ref 8.4–10.5)
Creatinine, Ser: 1.18 mg/dL (ref 0.50–1.35)

## 2013-01-31 MED ORDER — TETANUS-DIPHTH-ACELL PERTUSSIS 5-2.5-18.5 LF-MCG/0.5 IM SUSP
0.5000 mL | Freq: Once | INTRAMUSCULAR | Status: AC
  Administered 2013-01-31: 0.5 mL via INTRAMUSCULAR
  Filled 2013-01-31: qty 0.5

## 2013-01-31 MED ORDER — MORPHINE SULFATE 4 MG/ML IJ SOLN
4.0000 mg | Freq: Once | INTRAMUSCULAR | Status: AC
  Administered 2013-01-31: 4 mg via INTRAVENOUS
  Filled 2013-01-31: qty 1

## 2013-01-31 MED ORDER — TRAMADOL HCL 50 MG PO TABS: 50.0000 mg | ORAL_TABLET | Freq: Once | ORAL | Status: DC

## 2013-01-31 MED ORDER — IOHEXOL 350 MG/ML SOLN
100.0000 mL | Freq: Once | INTRAVENOUS | Status: AC | PRN
  Administered 2013-01-31: 100 mL via INTRAVENOUS

## 2013-01-31 MED ORDER — TRAMADOL HCL 50 MG PO TABS: 50.0000 mg | ORAL_TABLET | Freq: Four times a day (QID) | ORAL | Status: DC | PRN

## 2013-01-31 MED ORDER — TECHNETIUM TO 99M ALBUMIN AGGREGATED
6.0000 | Freq: Once | INTRAVENOUS | Status: AC | PRN
  Administered 2013-01-31: 6 via INTRAVENOUS

## 2013-01-31 MED ORDER — TECHNETIUM TC 99M DIETHYLENETRIAME-PENTAACETIC ACID: 40.0000 | Freq: Once | INTRAVENOUS | Status: DC | PRN

## 2013-01-31 MED ORDER — ONDANSETRON HCL 4 MG/2ML IJ SOLN
4.0000 mg | Freq: Once | INTRAMUSCULAR | Status: AC
  Administered 2013-01-31: 4 mg via INTRAVENOUS
  Filled 2013-01-31: qty 2

## 2013-01-31 NOTE — ED Notes (Signed)
Per Radiology, nuclear medicine has to special order a dose of medication for the pt, & it will be 2 hours before the pt can have the NM pulmonary perf & vent scan

## 2013-01-31 NOTE — ED Notes (Signed)
Woke up to go to br this am about5 am and woke up on floor did not remember falli9ng nose has abrasion and pt staets it bled a lot has black eye left

## 2013-01-31 NOTE — ED Notes (Addendum)
Pt presents with syncopal episode after getting up early this morning at 0430 today.  Pt reports he got up "and the next thing I know, I'm back in the bed".  Wife reports she heard pt fall and noted large amount of blood in the bathroom.  Pt was able to get back in the bed and go back to sleep.  -blurred vision or nausea; 3-4 cm laceration noted to nose, no active bleeding noted, pt reports epitaxis from bilateral nares. but is able to breath from each nare.  L Sclera is red, pt denies any vision change.  Pt able to bear weight on L hip.  Pt denies any dizziness at present.

## 2013-01-31 NOTE — ED Notes (Signed)
Also hit left hip is on no blood thinners

## 2013-01-31 NOTE — ED Notes (Addendum)
Patient transported to nuclear med.

## 2013-01-31 NOTE — ED Provider Notes (Signed)
Patient hand off to me by Dr. Blinda Leatherwood at the end of his shift. Patient waiting for VQ scan to evaluate for PE after syncopal episode. Possible ETOH on board, pt makes it clear he does not wish to be admitted despite tachycardia which may be etoh withdrawal.  Results for orders placed during the hospital encounter of 01/31/13  CBC WITH DIFFERENTIAL      Result Value Range   WBC 11.5 (*) 4.0 - 10.5 K/uL   RBC 5.15  4.22 - 5.81 MIL/uL   Hemoglobin 16.3  13.0 - 17.0 g/dL   HCT 16.1  09.6 - 04.5 %   MCV 92.6  78.0 - 100.0 fL   MCH 31.7  26.0 - 34.0 pg   MCHC 34.2  30.0 - 36.0 g/dL   RDW 40.9 (*) 81.1 - 91.4 %   Platelets 321  150 - 400 K/uL   Neutrophils Relative % 72  43 - 77 %   Neutro Abs 8.2 (*) 1.7 - 7.7 K/uL   Lymphocytes Relative 18  12 - 46 %   Lymphs Abs 2.1  0.7 - 4.0 K/uL   Monocytes Relative 9  3 - 12 %   Monocytes Absolute 1.0  0.1 - 1.0 K/uL   Eosinophils Relative 1  0 - 5 %   Eosinophils Absolute 0.1  0.0 - 0.7 K/uL   Basophils Relative 0  0 - 1 %   Basophils Absolute 0.0  0.0 - 0.1 K/uL  BASIC METABOLIC PANEL      Result Value Range   Sodium 142  135 - 145 mEq/L   Potassium 3.7  3.5 - 5.1 mEq/L   Chloride 102  96 - 112 mEq/L   CO2 22  19 - 32 mEq/L   Glucose, Bld 148 (*) 70 - 99 mg/dL   BUN 13  6 - 23 mg/dL   Creatinine, Ser 7.82  0.50 - 1.35 mg/dL   Calcium 9.6  8.4 - 95.6 mg/dL   GFR calc non Af Amer 62 (*) >90 mL/min   GFR calc Af Amer 72 (*) >90 mL/min  TROPONIN I      Result Value Range   Troponin I <0.30  <0.30 ng/mL  URINALYSIS, ROUTINE W REFLEX MICROSCOPIC      Result Value Range   Color, Urine YELLOW  YELLOW   APPearance CLEAR  CLEAR   Specific Gravity, Urine 1.021  1.005 - 1.030   pH 5.5  5.0 - 8.0   Glucose, UA NEGATIVE  NEGATIVE mg/dL   Hgb urine dipstick NEGATIVE  NEGATIVE   Bilirubin Urine NEGATIVE  NEGATIVE   Ketones, ur 15 (*) NEGATIVE mg/dL   Protein, ur NEGATIVE  NEGATIVE mg/dL   Urobilinogen, UA 0.2  0.0 - 1.0 mg/dL   Nitrite NEGATIVE   NEGATIVE   Leukocytes, UA NEGATIVE  NEGATIVE   Dg Chest 2 View  01/31/2013   *RADIOLOGY REPORT*  Clinical Data: Syncope  CHEST - 2 VIEW  Comparison: 05/17/2009  Findings: Lungs are essentially clear.  No focal consolidation.  No pleural effusion or pneumothorax.  The heart is normal in size.  Mild degenerative changes of the visualized thoracolumbar spine. Cervical spine fixation hardware.  IMPRESSION: No evidence of acute cardiopulmonary disease.   Original Report Authenticated By: Charline Bills, M.D.   Ct Head Wo Contrast  01/31/2013   *RADIOLOGY REPORT*  Clinical Data:  Fall with laceration of bridging of nose and bruising.  CT FACE  WITHOUT CONTRAST  Technique: Continous axial images  were obtained of face without contrast.  Sagittal and coronal reformats were constructed.  Comparison: Brain MR 01/21/2010.  Head CT 02/14/2008  Findings:  Soft tissue windows demonstrate soft tissue swelling and gas about the nasal bones, greater on the right than left.  Normal appearance of the orbits and globes.  Bone windows demonstrate Mucosal thickening right maxillary sinus with a mucous retention cyst or polyp within.  Fractures of bilateral nasal bones.  Right-sided fracture is comminuted.  There is also probable fracture of the right frontal process of the maxilla.  Deviation of the nasal septum to the right, of indeterminate acuity.  Mild mucosal thickening of the frontal sinuses.  Coronal reformats demonstrate intact orbital floors. Incompletely imaged anterior fixation beginning at C3.  Artifact degraded evaluation of the C1-C2 level.  Apparent irregularity involving the left lateral mass on coronal image 44 favored to be artifactual.  IMPRESSION: Soft tissue swelling with bilateral nasal bone fractures, greater on the right than left.  Nasal septal deviation, of indeterminate acuity.  Chronic sinus disease.  Dental hardware degradation involving the upper cervical spine. Apparent irregularity of the left  lateral mass is favored to be artifactual.  If there are cervical spine symptoms, consider dedicated CT.  CT HEAD  WITHOUT CONTRAST  Technique: Contiguous axial images were obtained from the base of the skull through the vertex without contrast  Findings:  Bone windows demonstrate no scalp soft tissue swelling. Mucosal thickening ethmoid air cells. Clear mastoid air cells.  Soft tissue windows demonstrate no  mass lesion, hemorrhage, hydrocephalus, acute infarct, intra-axial, or extra-axial fluid collection.  IMPRESSION:  No acute intracranial abnormality.   Original Report Authenticated By: Jeronimo Greaves, M.D.   Ct Angio Chest Pe W/cm &/or Wo Cm  01/31/2013   *RADIOLOGY REPORT*  Clinical Data: Syncope.  Tachycardia.  CT ANGIOGRAPHY CHEST  Technique:  Multidetector CT imaging of the chest using the standard protocol during bolus administration of intravenous contrast. Multiplanar reconstructed images including MIPs were obtained and reviewed to evaluate the vascular anatomy.  Contrast: OMNIPAQUE IOHEXOL 350 MG/ML SOLN  Comparison: Plain films earlier in the day.  No prior CT.  Findings: Lung windows demonstrate Motion degradation within the lower chest. Clear lungs.  Soft tissue windows:  The quality of this exam for evaluation of pulmonary embolism is poor to moderate.  In addition to motion artifact, the bolus is poorly timed, centered within the SVC.  No evidence of central or large lobar embolism.  Apparent filling defects in the right lower lobe pulmonary arterial tree, including on image 52/series 4, are favored to be artifactual due to motion.  Normal aortic caliber without dissection.  Mild cardiomegaly, without pericardial or pleural effusion.  Small mediastinal nodes, none which meet size criteria for pathologic enlargement.  No hilar adenopathy.  Tiny hiatal hernia.  Limited abdominal imaging demonstrates suspect mild hepatic steatosis with more focal steatosis adjacent the gallbladder. Remote left  clavicular fracture.  IMPRESSION:  1.  Degraded exam, secondary to motion and suboptimal bolus timing. No evidence of pulmonary embolism to the large lobar level. 2.  No other explanation for syncope. 3.  Cardiomegaly.   Original Report Authenticated By: Jeronimo Greaves, M.D.   Dg Esophagus  01/13/2013   *RADIOLOGY REPORT*  Clinical Data:Difficulty with mid chest pain after swallowing.  ESOPHAGUS/BARIUM SWALLOW/TABLET STUDY  Fluoroscopy Time: 1 minute 48 seconds.  Comparison: None.  Findings: Evaluation of swallowing shows silent aspiration. Esophageal motility is sluggish.  No esophageal fold thickening.  A distal  esophageal ring is seen, through which a 13 mm barium pill would not pass.  Tiny hiatal hernia.  IMPRESSION:  1.  Narrow distal esophageal ring, through which a 13 mm barium pill would not pass. 2.  Silent aspiration during swallowing. 3.  Decreased esophageal motility.   Original Report Authenticated By: Leanna Battles, M.D.   Nm Pulmonary Perf And Vent  01/31/2013   *RADIOLOGY REPORT*  Clinical Data:  Syncope.  Tachycardia.  Evaluate for pulmonary embolism.  NUCLEAR MEDICINE VENTILATION - PERFUSION LUNG SCAN  Technique:  Ventilation images were obtained in multiple projections using inhaled aerosol technetium 99 M DTPA.  Perfusion images were obtained in multiple projections after intravenous injection of Tc-3m MAA.  Radiopharmaceuticals:  Tc-25m DTPA aerosol and 6.0 mCi Tc-30m MAA.  Comparison: CT of 01/31/2013 and plain film of 01/31/2013.  Findings:  Ventilation:  No defect.  Perfusion:   No wedge shaped peripheral perfusion defects to suggest acute pulmonary embolism  IMPRESSION: No evidence of pulmonary embolism.   Original Report Authenticated By: Jeronimo Greaves, M.D.   Ct Maxillofacial Wo Cm  01/31/2013   *RADIOLOGY REPORT*  Clinical Data:  Fall with laceration of bridging of nose and bruising.  CT FACE  WITHOUT CONTRAST  Technique: Continous axial images were obtained of face without  contrast.  Sagittal and coronal reformats were constructed.  Comparison: Brain MR 01/21/2010.  Head CT 02/14/2008  Findings:  Soft tissue windows demonstrate soft tissue swelling and gas about the nasal bones, greater on the right than left.  Normal appearance of the orbits and globes.  Bone windows demonstrate Mucosal thickening right maxillary sinus with a mucous retention cyst or polyp within.  Fractures of bilateral nasal bones.  Right-sided fracture is comminuted.  There is also probable fracture of the right frontal process of the maxilla.  Deviation of the nasal septum to the right, of indeterminate acuity.  Mild mucosal thickening of the frontal sinuses.  Coronal reformats demonstrate intact orbital floors. Incompletely imaged anterior fixation beginning at C3.  Artifact degraded evaluation of the C1-C2 level.  Apparent irregularity involving the left lateral mass on coronal image 44 favored to be artifactual.  IMPRESSION: Soft tissue swelling with bilateral nasal bone fractures, greater on the right than left.  Nasal septal deviation, of indeterminate acuity.  Chronic sinus disease.  Dental hardware degradation involving the upper cervical spine. Apparent irregularity of the left lateral mass is favored to be artifactual.  If there are cervical spine symptoms, consider dedicated CT.  CT HEAD  WITHOUT CONTRAST  Technique: Contiguous axial images were obtained from the base of the skull through the vertex without contrast  Findings:  Bone windows demonstrate no scalp soft tissue swelling. Mucosal thickening ethmoid air cells. Clear mastoid air cells.  Soft tissue windows demonstrate no  mass lesion, hemorrhage, hydrocephalus, acute infarct, intra-axial, or extra-axial fluid collection.  IMPRESSION:  No acute intracranial abnormality.   Original Report Authenticated By: Jeronimo Greaves, M.D.   ' VQ scan negative. Patient has been aware and is currently stable. I ask that he follow up with his PCP or return to  the ED as needed.  Pt has been advised of the symptoms that warrant their return to the ED. Patient has voiced understanding and has agreed to follow-up with the PCP or specialist.   Dorthula Matas, PA-C 01/31/13 2105

## 2013-01-31 NOTE — ED Provider Notes (Signed)
History     CSN: [id number]  Arrival date & time 01/31/13  1356   First MD Initiated Contact with Patient 01/31/13 1410      Chief Complaint  Patient presents with  . Loss of Consciousness    (Consider location/radiation/quality/duration/timing/severity/associated sxs/prior treatment) HPI Comments: Patient comes to the ER for evaluation of injuries from a fall. Patient was reportedly walking to the bathroom around 5 AM and fell. Patient's wife reports that she found him in the bathroom confused, bleeding from the nose. She says he was having difficulty speaking at that time, answering her questions appropriately. He went back to bed and slept for some time.  Patient complains of pain in the left hip as well as facial pain.  Patient denies any chest pain, shortness of breath, palpitations.  Patient is a 67 y.o. male presenting with syncope.  Loss of Consciousness Associated symptoms: no chest pain, no dizziness, no headaches and no shortness of breath     Past Medical History  Diagnosis Date  . Hypertension     multiple medication intolerances  . Polycythemia     requires phlebotomies   . Alcohol consuption of more than two drinks per day     Past Surgical History  Procedure Laterality Date  . US echocardiography  2008    EF 50-55% , trivial AR,hypokinesis of mid inferior wall  . Nuclear stress test  08/2010    EF 49%, Normal  . Cardiac catheterization  2008  . Cervical discectomy  2010  . Appendectomy    . Left shoulder surgery      No family history on file.  History  Substance Use Topics  . Smoking status: Never Smoker   . Smokeless tobacco: Not on file  . Alcohol Use: Yes      Review of Systems  HENT: Positive for nosebleeds and facial swelling. Negative for neck pain.   Respiratory: Negative for shortness of breath.   Cardiovascular: Positive for syncope. Negative for chest pain.  Musculoskeletal: Negative for back pain.  Neurological: Negative for  dizziness and headaches.  All other systems reviewed and are negative.    Allergies  Amlodipine; Bystolic; Codeine; Lisinopril; Micardis hct; Toprol xl; and Triamterene  Home Medications   Current Outpatient Rx  Name  Route  Sig  Dispense  Refill  . allopurinol (ZYLOPRIM) 100 MG tablet      as directed.          [name] ALPRAZolam (XANAX) 0.5 MG tablet   Oral   Take 1 tablet by mouth as needed.         . ANDROGEL PUMP 20.25 MG/ACT (1.62%) GEL               . cyanocobalamin (,VITAMIN B-12,) 1000 MCG/ML injection      every 30 (thirty) days.          [name] diltiazem (CARDIZEM CD) 120 MG 24 hr capsule   Oral   Take 120 mg by mouth as directed. 1 daily in the evening         . losartan (COZAAR) 25 MG tablet   Oral   Take 1 tablet (25 mg total) by mouth daily.   90 tablet   3   . pantoprazole (PROTONIX) 40 MG tablet   Oral   Take 1 tablet by mouth Daily.         [name] zolpidem (AMBIEN) 10 MG tablet   Oral   Take 10 mg by mouth at bedtime as  needed.             BP 153/92  Pulse 109  Temp(Src) 98.3 F (36.8 C)  Resp 16  SpO2 94%  Physical Exam  Constitutional: He is oriented to person, place, and time. He appears well-developed and well-nourished. No distress.  HENT:  Head: Normocephalic.    Right Ear: Hearing normal.  Left Ear: Hearing normal.  Nose: Sinus tenderness present. No nasal septal hematoma. No epistaxis.  Mouth/Throat: Oropharynx is clear and moist and mucous membranes are normal.  Eyes: Conjunctivae and EOM are normal. Pupils are equal, round, and reactive to light.  Neck: Normal range of motion. Neck supple. No spinous process tenderness and no muscular tenderness present.  Cardiovascular: Regular rhythm, S1 normal and S2 normal.  Exam reveals no gallop and no friction rub.   No murmur heard. Pulmonary/Chest: Effort normal and breath sounds normal. No respiratory distress. He exhibits no tenderness.  Abdominal: Soft. Normal appearance and  bowel sounds are normal. There is no hepatosplenomegaly. There is no tenderness. There is no rebound, no guarding, no tenderness at McBurney's point and negative Murphy's sign. No hernia.  Musculoskeletal: Normal range of motion.       Left hip: He exhibits tenderness. He exhibits normal range of motion, normal strength and no deformity.       Cervical back: Normal.       Thoracic back: Normal.       Lumbar back: Normal.  Neurological: He is alert and oriented to person, place, and time. He has normal strength. No cranial nerve deficit or sensory deficit. Coordination normal. GCS eye subscore is 4. GCS verbal subscore is 5. GCS motor subscore is 6.  Skin: Skin is warm, dry and intact. No rash noted. No cyanosis.  Psychiatric: He has a normal mood and affect. His speech is normal and behavior is normal. Thought content normal.    ED Course  Procedures (including critical care time)  EKG  Date: 01/31/2013  Rate: 108  Rhythm: sinus tachycardia  QRS Axis: left  Intervals: normal  ST/T Wave abnormalities: normal  Conduction Disutrbances:none  Narrative Interpretation:   Old EKG Reviewed: unchanged    Labs Reviewed  CBC WITH DIFFERENTIAL - Abnormal; Notable for the following:    WBC 11.5 (*)    RDW 15.6 (*)    Neutro Abs 8.2 (*)    All other components within normal limits  BASIC METABOLIC PANEL - Abnormal; Notable for the following:    Glucose, Bld 148 (*)    GFR calc non Af Amer 62 (*)    GFR calc Af Amer 72 (*)    All other components within normal limits  URINALYSIS, ROUTINE W REFLEX MICROSCOPIC - Abnormal; Notable for the following:    Ketones, ur 15 (*)    All other components within normal limits  TROPONIN I   Dg Chest 2 View  01/31/2013   *RADIOLOGY REPORT*  Clinical Data: Syncope  CHEST - 2 VIEW  Comparison: 05/17/2009  Findings: Lungs are essentially clear.  No focal consolidation.  No pleural effusion or pneumothorax.  The heart is normal in size.  Mild degenerative  changes of the visualized thoracolumbar spine. Cervical spine fixation hardware.  IMPRESSION: No evidence of acute cardiopulmonary disease.   Original Report Authenticated By: Charline Bills, M.D.   Ct Head Wo Contrast  01/31/2013   *RADIOLOGY REPORT*  Clinical Data:  Fall with laceration of bridging of nose and bruising.  CT FACE  WITHOUT CONTRAST  Technique:  Continous axial images were obtained of face without contrast.  Sagittal and coronal reformats were constructed.  Comparison: Brain MR 01/21/2010.  Head CT 02/14/2008  Findings:  Soft tissue windows demonstrate soft tissue swelling and gas about the nasal bones, greater on the right than left.  Normal appearance of the orbits and globes.  Bone windows demonstrate Mucosal thickening right maxillary sinus with a mucous retention cyst or polyp within.  Fractures of bilateral nasal bones.  Right-sided fracture is comminuted.  There is also probable fracture of the right frontal process of the maxilla.  Deviation of the nasal septum to the right, of indeterminate acuity.  Mild mucosal thickening of the frontal sinuses.  Coronal reformats demonstrate intact orbital floors. Incompletely imaged anterior fixation beginning at C3.  Artifact degraded evaluation of the C1-C2 level.  Apparent irregularity involving the left lateral mass on coronal image 44 favored to be artifactual.  IMPRESSION: Soft tissue swelling with bilateral nasal bone fractures, greater on the right than left.  Nasal septal deviation, of indeterminate acuity.  Chronic sinus disease.  Dental hardware degradation involving the upper cervical spine. Apparent irregularity of the left lateral mass is favored to be artifactual.  If there are cervical spine symptoms, consider dedicated CT.  CT HEAD  WITHOUT CONTRAST  Technique: Contiguous axial images were obtained from the base of the skull through the vertex without contrast  Findings:  Bone windows demonstrate no scalp soft tissue swelling. Mucosal  thickening ethmoid air cells. Clear mastoid air cells.  Soft tissue windows demonstrate no  mass lesion, hemorrhage, hydrocephalus, acute infarct, intra-axial, or extra-axial fluid collection.  IMPRESSION:  No acute intracranial abnormality.   Original Report Authenticated By: Jeronimo Greaves, M.D.   Ct Angio Chest Pe W/cm &/or Wo Cm  01/31/2013   *RADIOLOGY REPORT*  Clinical Data: Syncope.  Tachycardia.  CT ANGIOGRAPHY CHEST  Technique:  Multidetector CT imaging of the chest using the standard protocol during bolus administration of intravenous contrast. Multiplanar reconstructed images including MIPs were obtained and reviewed to evaluate the vascular anatomy.  Contrast: OMNIPAQUE IOHEXOL 350 MG/ML SOLN  Comparison: Plain films earlier in the day.  No prior CT.  Findings: Lung windows demonstrate Motion degradation within the lower chest. Clear lungs.  Soft tissue windows:  The quality of this exam for evaluation of pulmonary embolism is poor to moderate.  In addition to motion artifact, the bolus is poorly timed, centered within the SVC.  No evidence of central or large lobar embolism.  Apparent filling defects in the right lower lobe pulmonary arterial tree, including on image 52/series 4, are favored to be artifactual due to motion.  Normal aortic caliber without dissection.  Mild cardiomegaly, without pericardial or pleural effusion.  Small mediastinal nodes, none which meet size criteria for pathologic enlargement.  No hilar adenopathy.  Tiny hiatal hernia.  Limited abdominal imaging demonstrates suspect mild hepatic steatosis with more focal steatosis adjacent the gallbladder. Remote left clavicular fracture.  IMPRESSION:  1.  Degraded exam, secondary to motion and suboptimal bolus timing. No evidence of pulmonary embolism to the large lobar level. 2.  No other explanation for syncope. 3.  Cardiomegaly.   Original Report Authenticated By: Jeronimo Greaves, M.D.   Ct Maxillofacial Wo Cm  01/31/2013    *RADIOLOGY REPORT*  Clinical Data:  Fall with laceration of bridging of nose and bruising.  CT FACE  WITHOUT CONTRAST  Technique: Continous axial images were obtained of face without contrast.  Sagittal and coronal reformats were constructed.  Comparison: Brain MR 01/21/2010.  Head CT 02/14/2008  Findings:  Soft tissue windows demonstrate soft tissue swelling and gas about the nasal bones, greater on the right than left.  Normal appearance of the orbits and globes.  Bone windows demonstrate Mucosal thickening right maxillary sinus with a mucous retention cyst or polyp within.  Fractures of bilateral nasal bones.  Right-sided fracture is comminuted.  There is also probable fracture of the right frontal process of the maxilla.  Deviation of the nasal septum to the right, of indeterminate acuity.  Mild mucosal thickening of the frontal sinuses.  Coronal reformats demonstrate intact orbital floors. Incompletely imaged anterior fixation beginning at C3.  Artifact degraded evaluation of the C1-C2 level.  Apparent irregularity involving the left lateral mass on coronal image 44 favored to be artifactual.  IMPRESSION: Soft tissue swelling with bilateral nasal bone fractures, greater on the right than left.  Nasal septal deviation, of indeterminate acuity.  Chronic sinus disease.  Dental hardware degradation involving the upper cervical spine. Apparent irregularity of the left lateral mass is favored to be artifactual.  If there are cervical spine symptoms, consider dedicated CT.  CT HEAD  WITHOUT CONTRAST  Technique: Contiguous axial images were obtained from the base of the skull through the vertex without contrast  Findings:  Bone windows demonstrate no scalp soft tissue swelling. Mucosal thickening ethmoid air cells. Clear mastoid air cells.  Soft tissue windows demonstrate no  mass lesion, hemorrhage, hydrocephalus, acute infarct, intra-axial, or extra-axial fluid collection.  IMPRESSION:  No acute intracranial  abnormality.   Original Report Authenticated By: Jeronimo Greaves, M.D.     Diagnosis: Syncope    MDM  Patient presents to the ER for evaluation of a syncopal episode. Patient apparently passed out while in the bathroom, falling coordinating his face. Patient had bleeding from both nostrils for a period of time, this eventually stopped. CT of face and head shows nasal fractures without any other pathology. Patient has a laceration on the bridge of the nose as well. There is no neck pain or tenderness. Patient is awake and alert, not to be clinically cleared based on this.  Patient was tachycardic on arrival. It's not clear what caused the tachycardia. Patient underwent CT angiography to rule out PE. OCD crated exam because of motion artifact as well as suboptimal bolus timing. No obvious PE was seen.  I did discuss with the patient possibility of observing in the hospital and had a VQ scan performed. Patient declines admission. Had a lengthy conversation with him and his significant other. I did counsel him that he could have a heart arrhythmia, seizures or other significant medical conditions such as PE that caused the syncope and this could be fatal. He understands this, but does not wish to be observed. His significant other is in agreement with this plan. They both understand that he can otherwise threatening condition.  Since the patient has declined admission to the hospital and a CT angiography was not definitive, have ordered a VQ scan. Patient will be discharged by his request, as he is declining admission, but I felt it was important to rule out PE T. him all the information before we make that decision. Case will be signed out to oncoming provider to follow up VQ scan. If positive, information will be given to the patient to try to get him to accept admission. Otherwise will be discharged by his request.      Gilda Crease, MD 01/31/13 [facility] [place]

## 2013-02-04 NOTE — ED Provider Notes (Signed)
Medical screening examination/treatment/procedure(s) were performed by non-physician practitioner and as supervising physician I was immediately available for consultation/collaboration.   Gilda Crease, MD 02/04/13 1430

## 2013-02-18 ENCOUNTER — Other Ambulatory Visit: Payer: Self-pay | Admitting: Neurological Surgery

## 2013-02-18 DIAGNOSIS — M542 Cervicalgia: Secondary | ICD-10-CM

## 2013-02-20 ENCOUNTER — Other Ambulatory Visit: Payer: Medicare Other

## 2013-03-19 ENCOUNTER — Telehealth: Payer: Self-pay | Admitting: Cardiology

## 2013-03-19 MED ORDER — DILTIAZEM HCL ER COATED BEADS 120 MG PO CP24: 120.0000 mg | ORAL_CAPSULE | ORAL | Status: DC

## 2013-03-19 NOTE — Telephone Encounter (Signed)
New Prob    Pt states he is out of town but accidentally left his CARDIZEM at home. Requesting a small prescription be called in a pharmacy near him. Please call.

## 2013-03-19 NOTE — Telephone Encounter (Signed)
Sent rx for Cardizem to pharmacy where patient is on vacation.

## 2013-04-07 ENCOUNTER — Encounter: Payer: Self-pay | Admitting: Cardiology

## 2013-04-07 ENCOUNTER — Ambulatory Visit (INDEPENDENT_AMBULATORY_CARE_PROVIDER_SITE_OTHER): Payer: Medicare Other | Admitting: Cardiology

## 2013-04-07 VITALS — BP 124/80 | HR 76 | Ht 74.0 in | Wt 212.4 lb

## 2013-04-07 DIAGNOSIS — I119 Hypertensive heart disease without heart failure: Secondary | ICD-10-CM

## 2013-04-07 DIAGNOSIS — R1310 Dysphagia, unspecified: Secondary | ICD-10-CM | POA: Insufficient documentation

## 2013-04-07 NOTE — Patient Instructions (Addendum)
Your physician recommends that you schedule a follow-up appointment in: 3 months. Your physician recommends that you continue on your current medications as directed. Please refer to the Current Medication list given to you today. 

## 2013-04-07 NOTE — Progress Notes (Signed)
Brian Villegas Date of Birth:  1946/07/16 [facility] [address] [place], [place]  [address] [phone]         Fax   [phone]  History of Present Illness: This pleasant 67 year old gentleman is seen for a followup office visit. He has a history of essential hypertension. [name] However the patient feels that this may be causing him to have fatigue and lack of energy. The patient denies any chest pain or palpitations. He had an echocardiogram on 12/04/06 which showed normal LV systolic function and a pseudo-normal left ventricular filling pattern. There is trivial aortic valvular regurgitation. The patient had a nuclear stress test on 09/01/10 which showed no ischemia and which showed an ejection fraction of 49% and a hypertensive blood pressure response to exercise. More recently the patient had a followup echocardiogram on 10/24/11 which showed that his ejection fraction remains normal at 55-60% and he has grade 1 diastolic dysfunction at his last office visit 2 months ago we reduced his diltiazem to once a day and added losartan 25 mg daily. He has felt well on this new regimen and his blood pressure has been more satisfactory. Since last office visit the patient went to the emergency room on the evening of 01/31/13 after having an episode of syncope or possibly falling at home in the middle of the night he suffered a broken nose and was told that he had a concussion he subsequently saw his neurosurgeon Dr. Marikay Alar he had a CT angiogram which did not show any evidence of pulmonary embolus he was found to have a esophageal ring on barium swallow.  He has an appointment to see Dr. Matthias Hughs soon.   Current Outpatient Prescriptions  Medication Sig Dispense Refill  . allopurinol (ZYLOPRIM) 100 MG tablet Take 100 mg by mouth as directed. Rarely takes      . ALPRAZolam (XANAX) 0.5 MG tablet Take 1 tablet by mouth as needed for anxiety.       . ANDROGEL PUMP 20.25 MG/ACT (1.62%)  GEL       . cyanocobalamin (,VITAMIN B-12,) 1000 MCG/ML injection Inject 100 mcg into the muscle every 30 (thirty) days.       [name] diltiazem (CARDIZEM CD) 120 MG 24 hr capsule Take 1 capsule (120 mg total) by mouth as directed. 1 daily in the evening  30 capsule  0  . losartan (COZAAR) 25 MG tablet Take 25 mg by mouth daily.      . pantoprazole (PROTONIX) 20 MG tablet Take 20 mg by mouth daily.      . traMADol (ULTRAM) 50 MG tablet Take 1 tablet (50 mg total) by mouth every 6 (six) hours as needed for pain.  10 tablet  0  . zolpidem (AMBIEN) 10 MG tablet Take 10 mg by mouth at bedtime as needed for sleep.        No current facility-administered medications for this visit.    Allergies  Allergen Reactions  . Amlodipine   . Bystolic (Nebivolol Hcl)   . Codeine   . Lisinopril   . Micardis Hct (Telmisartan-Hctz)     Cough, anxious  . Toprol Xl (Metoprolol Succinate)   . Triamterene     Patient Active Problem List   Diagnosis Date Noted  . Benign hypertensive heart disease without heart failure 01/31/2011  . Malaise and fatigue 01/31/2011    History  Smoking status  . Never Smoker   Smokeless tobacco  . Not on file  History  Alcohol Use  . Yes    No family history on file.  Review of Systems: Constitutional: no fever chills diaphoresis or fatigue or change in weight.  Head and neck: no hearing loss, no epistaxis, no photophobia or visual disturbance. Respiratory: No cough, shortness of breath or wheezing. Cardiovascular: No chest pain peripheral edema, palpitations. Gastrointestinal: No abdominal distention, no abdominal pain, no change in bowel habits hematochezia or melena. Genitourinary: No dysuria, no frequency, no urgency, no nocturia. Musculoskeletal:No arthralgias, no back pain, no gait disturbance or myalgias. Neurological: No dizziness, no headaches, no numbness, no seizures, no syncope, no weakness, no tremors. Hematologic: No lymphadenopathy, no easy  bruising. Psychiatric: No confusion, no hallucinations, no sleep disturbance.    Physical Exam: Filed Vitals:   04/07/13 0936  BP: 124/80  Pulse: 76   the general appearance reveals a well-developed well-nourished gentleman in no distress.The head and neck exam reveals pupils equal and reactive.  Extraocular movements are full.  There is no scleral icterus.  The mouth and pharynx are normal.  The neck is supple.  The carotids reveal no bruits.  The jugular venous pressure is normal.  The  thyroid is not enlarged.  There is no lymphadenopathy.  The chest is clear to percussion and auscultation.  There are no rales or rhonchi.  Expansion of the chest is symmetrical.  The precordium is quiet.  The first heart sound is normal.  The second heart sound is physiologically split.  There is no murmur gallop rub or click.  There is no abnormal lift or heave.  The abdomen is soft and nontender.  The bowel sounds are normal.  The liver and spleen are not enlarged.  There are no abdominal masses.  There are no abdominal bruits.  Extremities reveal good pedal pulses.  There is no phlebitis or edema.  There is no cyanosis or clubbing.  Strength is normal and symmetrical in all extremities.  There is no lateralizing weakness.  There are no sensory deficits.  The skin is warm and dry.  There is no rash.     Assessment / Plan: The patient is to continue same medication.  Recheck in 3 months for office visit and EKG.  In view of his other medical problems I have suggested that he avoid alcohol going forward.

## 2013-04-07 NOTE — Assessment & Plan Note (Signed)
Patient still complains of lack of energy.  Since we last saw him here he has been diagnosed with low vitamin D level, low B12 level, and low iron level.  He is now receiving appropriate supplements. The patient continues to drink moderate alcohol which may also be contributing to his symptomatology.  He drinks vodka.

## 2013-04-07 NOTE — Assessment & Plan Note (Signed)
Blood pressure was remaining stable on current therapy of diltiazem and losartan.

## 2013-05-08 ENCOUNTER — Other Ambulatory Visit: Payer: Self-pay | Admitting: Internal Medicine

## 2013-05-08 DIAGNOSIS — I671 Cerebral aneurysm, nonruptured: Secondary | ICD-10-CM

## 2013-05-08 DIAGNOSIS — R51 Headache: Secondary | ICD-10-CM

## 2013-05-10 ENCOUNTER — Ambulatory Visit
Admission: RE | Admit: 2013-05-10 | Discharge: 2013-05-10 | Disposition: A | Discharge status: Home / Self Care | Payer: Medicare Other | Source: Ambulatory Visit | Attending: Internal Medicine | Admitting: Internal Medicine

## 2013-05-11 ENCOUNTER — Inpatient Hospital Stay: Admission: RE | Admit: 2013-05-11 | Payer: Medicare Other | Source: Ambulatory Visit

## 2013-06-04 ENCOUNTER — Ambulatory Visit (INDEPENDENT_AMBULATORY_CARE_PROVIDER_SITE_OTHER): Payer: Medicare Other | Admitting: Cardiology

## 2013-06-04 ENCOUNTER — Encounter: Payer: Self-pay | Admitting: Cardiology

## 2013-06-04 VITALS — BP 128/84 | HR 100 | Ht 74.0 in | Wt 215.0 lb

## 2013-06-04 DIAGNOSIS — D45 Polycythemia vera: Secondary | ICD-10-CM

## 2013-06-04 DIAGNOSIS — D751 Secondary polycythemia: Secondary | ICD-10-CM

## 2013-06-04 DIAGNOSIS — I119 Hypertensive heart disease without heart failure: Secondary | ICD-10-CM

## 2013-06-04 DIAGNOSIS — R0609 Other forms of dyspnea: Secondary | ICD-10-CM

## 2013-06-04 DIAGNOSIS — I671 Cerebral aneurysm, nonruptured: Secondary | ICD-10-CM | POA: Insufficient documentation

## 2013-06-04 NOTE — Progress Notes (Signed)
Brian Villegas Date of Birth:  [birth year] [facility] [address] [place], [place]  [address] [phone]         Fax   [phone]  History of Present Illness: This pleasant 67 year old gentleman is seen for a followup office visit. He has a history of essential hypertension. [name] However the patient feels that this may be causing him to have fatigue and lack of energy. The patient denies any chest pain or palpitations. He had an echocardiogram on 12/04/06 which showed normal LV systolic function and a pseudo-normal left ventricular filling pattern. There is trivial aortic valvular regurgitation. The patient had a nuclear stress test on 09/01/10 which showed no ischemia and which showed an ejection fraction of 49% and a hypertensive blood pressure response to exercise. More recently the patient had a followup echocardiogram on 10/24/11 which showed that his ejection fraction remains normal at 55-60% and he has grade 1 diastolic dysfunction at his last office visit 2 months ago we reduced his diltiazem to once a day and added losartan 25 mg daily. He has felt well on this new regimen and his blood pressure has been more satisfactory. Since last office visit the patient went to the emergency room on the evening of 01/31/13 after having an episode of syncope or possibly falling at home in the middle of the night he suffered a broken nose and was told that he had a concussion he subsequently saw his neurosurgeon Dr. Marikay Alar he had a CT angiogram which did not show any evidence of pulmonary embolus he was found to have a esophageal ring on barium swallow.  He has seen Dr. Matthias Hughs.  No surgery is planned for the near future. The patient has had increasing shortness of breath.  He has decreased stamina.  He had a normal ventilation perfusion lung scan on 01/31/13.  He had a normal chest x-ray on 01/31/13.  Current Outpatient Prescriptions  Medication Sig Dispense Refill  . allopurinol  (ZYLOPRIM) 100 MG tablet Take 100 mg by mouth as directed. Rarely takes      . ALPRAZolam (XANAX) 0.5 MG tablet Take 1 tablet by mouth as needed for anxiety.       . ANDROGEL PUMP 20.25 MG/ACT (1.62%) GEL       . cyanocobalamin (,VITAMIN B-12,) 1000 MCG/ML injection Inject 100 mcg into the muscle every 30 (thirty) days.       [name] diltiazem (CARDIZEM CD) 120 MG 24 hr capsule Take 1 capsule (120 mg total) by mouth as directed. 1 daily in the evening  30 capsule  0  . losartan (COZAAR) 25 MG tablet Take 25 mg by mouth daily.      . pantoprazole (PROTONIX) 20 MG tablet Take 20 mg by mouth daily.      . traMADol (ULTRAM) 50 MG tablet Take 1 tablet (50 mg total) by mouth every 6 (six) hours as needed for pain.  10 tablet  0  . zolpidem (AMBIEN) 10 MG tablet Take 10 mg by mouth at bedtime as needed for sleep.       [name] buPROPion (WELLBUTRIN XL) 150 MG 24 hr tablet        No current facility-administered medications for this visit.    Allergies  Allergen Reactions  . Amlodipine   . Bystolic [Nebivolol Hcl]   . Codeine   . Lisinopril   . Micardis Hct [Telmisartan-Hctz]     Cough, anxious  . Toprol Xl [Metoprolol Succinate]   . Triamterene  Patient Active Problem List   Diagnosis Date Noted  . Dyspnea on exertion 06/04/2013  . Polycythemia 06/04/2013  . Dysphagia 04/07/2013  . Benign hypertensive heart disease without heart failure 01/31/2011  . Malaise and fatigue 01/31/2011    History  Smoking status  . Never Smoker   Smokeless tobacco  . Not on file    History  Alcohol Use  . Yes    No family history on file.  Review of Systems: Constitutional: no fever chills diaphoresis or fatigue or change in weight.  Head and neck: no hearing loss, no epistaxis, no photophobia or visual disturbance. Respiratory: No cough, shortness of breath or wheezing. Cardiovascular: No chest pain peripheral edema, palpitations. Gastrointestinal: No abdominal distention, no abdominal pain, no  change in bowel habits hematochezia or melena. Genitourinary: No dysuria, no frequency, no urgency, no nocturia. Musculoskeletal:No arthralgias, no back pain, no gait disturbance or myalgias. Neurological: No dizziness, no headaches, no numbness, no seizures, no syncope, no weakness, no tremors. Hematologic: No lymphadenopathy, no easy bruising. Psychiatric: No confusion, no hallucinations, no sleep disturbance.    Physical Exam: Filed Vitals:   06/04/13 0913  BP: 128/84  Pulse: 100   the general appearance reveals a well-developed well-nourished gentleman in no distress.The head and neck exam reveals pupils equal and reactive.  Extraocular movements are full.  There is no scleral icterus.  The mouth and pharynx are normal.  The neck is supple.  The carotids reveal no bruits.  The jugular venous pressure is normal.  The  thyroid is not enlarged.  There is no lymphadenopathy.  The chest is clear to percussion and auscultation.  There are no rales or rhonchi.  Expansion of the chest is symmetrical.  The precordium is quiet.  The first heart sound is normal.  The second heart sound is physiologically split.  There is no murmur gallop rub or click.  There is no abnormal lift or heave.  The abdomen is soft and nontender.  The bowel sounds are normal.  The liver and spleen are not enlarged.  There are no abdominal masses.  There are no abdominal bruits.  Extremities reveal good pedal pulses.  There is no phlebitis or edema.  There is no cyanosis or clubbing.  Strength is normal and symmetrical in all extremities.  There is no lateralizing weakness.  There are no sensory deficits.  The skin is warm and dry.  There is no rash.     Assessment / Plan: The patient is to continue same medication.  He will return soon for a walking Lexus scan Myoview stress test.  He is still consuming moderate alcohol and I have asked him to cut back since this could also be contributing to his sensation of muscle weakness  and dyspnea and easy fatigue.  The patient is no longer working.  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

## 2013-06-04 NOTE — Patient Instructions (Signed)
Your physician has requested that you have a lexiscan myoview. For further information please visit [email or web address] Please follow instruction sheet, as given.  Your physician recommends that you continue on your current medications as directed. Please refer to the Current Medication list given to you today.  Your physician wants you to follow-up in: 3 months You will receive a reminder letter in the mail two months in advance. If you don't receive a letter, please call our office to schedule the follow-up appointment.

## 2013-06-04 NOTE — Assessment & Plan Note (Signed)
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  Apparently no surgery is planned unless the aneurysm begins to expand significantly.

## 2013-06-04 NOTE — Assessment & Plan Note (Signed)
Patient has a long history of polycythemia.  He is followed by an oncologist in [place] who does periodic phlebotomies if his hematocrit rises above 45.

## 2013-06-04 NOTE — Assessment & Plan Note (Signed)
Blood pressure has been remaining stable on current therapy.  However it has not helped his sense of well-being or sense of ability to accomplish physical work

## 2013-06-04 NOTE — Assessment & Plan Note (Signed)
The patient feels that his stamina and his daughter to exercise is decreasing.  He has tried using a treadmill at sport time but tires very quickly.  He has not been experiencing any definite chest pain but just a sensation of shortness of breath and extreme fatigue.  His last Myoview was in 2011.  We will have him return for a walking Lexa scan Myoview.  He does not feel that he is strong enough to perform on a regular treadmill test.

## 2013-06-12 ENCOUNTER — Encounter: Payer: Self-pay | Admitting: Cardiology

## 2013-06-18 ENCOUNTER — Ambulatory Visit: Payer: Medicare Other | Admitting: Neurology

## 2013-06-18 ENCOUNTER — Ambulatory Visit (INDEPENDENT_AMBULATORY_CARE_PROVIDER_SITE_OTHER): Payer: Medicare Other | Admitting: Neurology

## 2013-06-18 ENCOUNTER — Encounter (INDEPENDENT_AMBULATORY_CARE_PROVIDER_SITE_OTHER): Payer: Self-pay

## 2013-06-18 ENCOUNTER — Encounter: Payer: Self-pay | Admitting: Neurology

## 2013-06-18 VITALS — BP 122/84 | HR 84 | Ht 76.0 in | Wt 217.0 lb

## 2013-06-18 DIAGNOSIS — R4189 Other symptoms and signs involving cognitive functions and awareness: Secondary | ICD-10-CM

## 2013-06-18 DIAGNOSIS — F09 Unspecified mental disorder due to known physiological condition: Secondary | ICD-10-CM

## 2013-06-18 DIAGNOSIS — G4710 Hypersomnia, unspecified: Secondary | ICD-10-CM

## 2013-06-18 DIAGNOSIS — S060X9A Concussion with loss of consciousness of unspecified duration, initial encounter: Secondary | ICD-10-CM

## 2013-06-18 DIAGNOSIS — S069X9A Unspecified intracranial injury with loss of consciousness of unspecified duration, initial encounter: Secondary | ICD-10-CM

## 2013-06-18 DIAGNOSIS — R4701 Aphasia: Secondary | ICD-10-CM

## 2013-06-18 DIAGNOSIS — R270 Ataxia, unspecified: Secondary | ICD-10-CM

## 2013-06-18 DIAGNOSIS — S060X0A Concussion without loss of consciousness, initial encounter: Secondary | ICD-10-CM

## 2013-06-18 DIAGNOSIS — R51 Headache: Secondary | ICD-10-CM

## 2013-06-18 DIAGNOSIS — R279 Unspecified lack of coordination: Secondary | ICD-10-CM

## 2013-06-18 HISTORY — DX: Concussion with loss of consciousness of unspecified duration, initial encounter: S06.0X9A

## 2013-06-18 MED ORDER — LAMOTRIGINE 25 MG PO TABS: 25.0000 mg | ORAL_TABLET | Freq: Two times a day (BID) | ORAL | Status: DC

## 2013-06-18 NOTE — Progress Notes (Addendum)
[facility]  Provider:  Melvyn Novas, M D  Referring Provider: Alysia Penna, MD Primary Care Physician:  Alysia Penna, MD  Chief Complaint  Patient presents with  . memory loss/ Headaches    consult/Vision was tsted  20/23 Rt eye 20/20 Lt eye corected Pt was given the MMSE Test today Pt scored a 30    HPI:  Brian Villegas is a 67 y.o. male  Is seen here as a referral   from Dr. Link Snuffer to the [facility] . He was seen in 2007 by Dr Raiford Simmonds.  The patient reports problems staying awake, napping in the afternoon, and he reports memory loss and confusion, difficulties spelling familiar words.   Excruciating headaches- it takes severeal minutes for him to explain, that  all  This begun after a concussion.  His coordination and orientation are affected,  He is photophobic, and he seems to have tangential , rambling thoughts. The patient is unaccompanied for this visit, which makes objective history impossible.    He is unable to tell me how often he may have been concussed in the past.  He was a [employer] in high school, basketball player in college.   He broke his nose 4 times ! He [place] terrain [place], possibly a head injury, too.    Last concussion in June 2014 , [facility] physician is now concerned about a  Post-concussion syndrome , involving cognitive changes , headaches and coordination loss.   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  The patient has a history of trivial AV regurgitation,  a nuclear stress test was normal  in 2011 , he had a transthoracic echocardiogram in 2013 that was normal.  His hypertension is managed by Dr. Patty Sermons.  He had a fall with a nasal fracture that seems to be related to the onset of the current cognitive symptoms in June 2014 .  On 01/31/2013 he had an esophageal test with Dr. Matthias Hughs. The cervical fusion x2 , the  last of these took place  in 2007 .    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   He uses Ambien every night, has chronic insomnia.  He sleeps from 11 PM to about 8 AM , he feels that his average nightly sleep duration  is 9 hours.   He has rare bathroom breaks,  Wife noted snoring but has not told him about apneas.       Review of Systems: Out of a complete 14 system review, the patient complains of only the following symptoms, and all other reviewed systems are negative. The patient endorsed daytime napping , excessive sleepiness fatigue blurred vision, memory loss, confusion, headaches, weakness a feeling of passing out or fainting, insomnia anxiety a runny nose and ringing in the ears.  History   Social History  . Marital Status: Married    Spouse Name: N/A    Number of Children: N/A  . Years of Education: N/A   Occupational History  . Not on file.   Social History Main Topics  . Smoking status: Never Smoker   . Smokeless tobacco: Not on file  . Alcohol Use: Yes  . Drug Use: No  . Sexual Activity: No   Other Topics Concern  .  Not on file   Social History Narrative   Pt is retired lives w wife of 17 yrs 2 children    No family history on file.  Past Medical History  Diagnosis Date  . Hypertension     multiple medication intolerances  . Polycythemia     requires phlebotomies   . Alcohol consuption of more than two drinks per day   . Memory loss     Past Surgical History  Procedure Laterality Date  . US echocardiography  2008    EF 50-55% , trivial AR,hypokinesis of mid inferior wall  . Nuclear stress test  08/2010    EF 49%, Normal  . Cardiac catheterization  2008  . Cervical discectomy  2010  . Appendectomy    . Left shoulder surgery      Current Outpatient Prescriptions  Medication Sig Dispense Refill  . allopurinol (ZYLOPRIM) 100 MG tablet Take 100 mg by mouth as directed.  Rarely takes      . buPROPion (WELLBUTRIN XL) 150 MG 24 hr tablet       . cyanocobalamin (,VITAMIN B-12,) 1000 MCG/ML injection Inject 100 mcg into the muscle every 30 (thirty) days.       [name] diltiazem (CARDIZEM CD) 120 MG 24 hr capsule Take 1 capsule (120 mg total) by mouth as directed. 1 daily in the evening  30 capsule  0  . losartan (COZAAR) 25 MG tablet Take 25 mg by mouth daily.      . pantoprazole (PROTONIX) 20 MG tablet Take 20 mg by mouth daily.      [name] zolpidem (AMBIEN) 10 MG tablet Take 10 mg by mouth at bedtime as needed for sleep.       [name] ALPRAZolam (XANAX) 0.5 MG tablet Take 1 tablet by mouth as needed for anxiety.       . ANDROGEL PUMP 20.25 MG/ACT (1.62%) GEL       . traMADol (ULTRAM) 50 MG tablet Take 1 tablet (50 mg total) by mouth every 6 (six) hours as needed for pain.  10 tablet  0   No current facility-administered medications for this visit.    Allergies as of 06/18/2013 - Review Complete 06/18/2013  Allergen Reaction Noted  . Amlodipine  01/16/2011  . Bystolic [nebivolol hcl]  01/16/2011  . Codeine    . Lisinopril  01/16/2011  . Micardis hct [telmisartan-hctz]  06/26/2011  . Olmesartan Other (See Comments) 06/18/2013  . Toprol xl [metoprolol succinate]  01/16/2011  . Triamterene  01/16/2011    Vitals: BP 122/84  Pulse 84  Ht 6\' 4"  (1.93 m)  Wt 217 lb (98.431 kg)  BMI 26.43 kg/m2 Last Weight:  Wt Readings from Last 1 Encounters:  06/18/13 217 lb (98.431 kg)   Last Height:   Ht Readings from Last 1 Encounters:  06/18/13 6\' 4"  (1.93 m)    Physical exam:  General: The patient is awake, alert and appears not in acute distress. The patient is well groomed. Head: Normocephalic, atraumatic. Neck is supple. Mallampati 2 , neck circumference: 14 inches . Deviated  Septum nasal- Cardiovascular:  Regular rate and rhythm , without  murmurs or carotid bruit, and without distended neck veins. Respiratory: Lungs are clear to auscultation. Skin:  Without evidence  of edema, or rash Trunk: BMI is normal .  Neurologic exam : The patient is awake and alert, oriented to place and time.  Memory  attention span & concentration ability are felt to be impaired.  His MMSE today is  30-30 . MOCA 25-30 . Speech is fluent with dysphonia.  Mood and affect are depressed, fatigued.  Cranial nerves: Pupils are equal and briskly reactive to light. Funduscopic exam without  evidence of pallor or edema. Extraocular movements  in vertical and horizontal planes intact and without nystagmus. Visual fields by finger perimetry are intact. Hearing to finger rub intact.   Facial sensation intact to fine touch. Facial motor strength is symmetric and tongue and uvula move midline.  Motor exam:    Normal tone and normal muscle bulk and symmetric normal strength in all extremities.  Sensory:  Fine touch, pinprick and vibration were tested in all extremities. Proprioception is normal.  Coordination: Rapid alternating movements in the fingers/hands is tested and normal. Finger-to-nose maneuver tested and normal without evidence of ataxia, dysmetria or tremor.  Gait and station: Patient walks without assistive device . Strength within normal limits.  He can neither toe nor heel walk, is unsteady , and wide based.   Spontaneous stance is stable and normal.  Tandem gait is fragmented and ataxic, he is unable to walk tandem without holding to either wall or furniture. .  Romberg testing is positive .  Deep tendon reflexes: in the  upper and lower extremities are symmetric- attenuated  and intact. Babinski maneuver response is equivocal .   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   His  Speech slurred , dysphonic , non- melodic speech, the ptosis and his ataxia may be a separate issue. Possibly genetic, possibly post-consussion  .  He has amnestic spells that may be micro-sleeps.    EEG and if normal PSG with seizure montage. He indicated that he took BIPOLAR medication in the past. (?) Epworth 15 .  Neuro cognitive testing to rule out other forms of dementia.    Plan:  Treatment plan and additional workup :  referral to Dr. Leonides Cave for detailed neuropsychological battery testing. MOCA  In follow up at [facility].   Today 25-30 points.   Need to speak with spouse and get her observations on memory issues, amnesia, sleep. EEG .  SPLIT study.   Visit duration over 60 minutes , motor and memory exam, history and material review.

## 2013-06-18 NOTE — Patient Instructions (Signed)
fFall Prevention and Home Safety Falls cause injuries and can affect all age groups. It is possible to use prventive measures to significantly decrease the likelihood of falls. There are many simple measures which can make your home safer and prevent falls. OUTDOORS  Repair cracks and edges of walkways and driveways.  Remove high doorway thresholds.  Trim shrubbery on the main path into your home.  Have good outside lighting.  Clear walkways of tools, rocks, debris, and clutter.  Check that handrails are not broken and are securely fastened. Both sides of steps should have handrails.  Have leaves, snow, and ice cleared regularly.  Use sand or salt on walkways during winter months.  In the garage, clean up grease or oil spills. BATHROOM  Install night lights.  Install grab bars by the toilet and in the tub and shower.  Use non-skid mats or decals in the tub or shower.  Place a plastic non-slip stool in the shower to sit on, if needed.  Keep floors dry and clean up all water on the floor immediately.  Remove soap buildup in the tub or shower on a regular basis.  Secure bath mats with non-slip, double-sided rug tape.  Remove throw rugs and tripping hazards from the floors. BEDROOMS  Install night lights.  Make sure a bedside light is easy to reach.  Do not use oversized bedding.  Keep a telephone by your bedside.  Have a firm chair with side arms to use for getting dressed.  Remove throw rugs and tripping hazards from the floor. KITCHEN  Keep handles on pots and pans turned toward the center of the stove. Use back burners when possible.  Clean up spills quickly and allow time for drying.  Avoid walking on wet floors.  Avoid hot utensils and knives.  Position shelves so they are not too high or low.  Place commonly used objects within easy reach.  If necessary, use a sturdy step stool with a grab bar when reaching.  Keep electrical cables out of the  way.  Do not use floor polish or wax that makes floors slippery. If you must use wax, use non-skid floor wax.  Remove throw rugs and tripping hazards from the floor. STAIRWAYS  Never leave objects on stairs.  Place handrails on both sides of stairways and use them. Fix any loose handrails. Make sure handrails on both sides of the stairways are as long as the stairs.  Check carpeting to make sure it is firmly attached along stairs. Make repairs to worn or loose carpet promptly.  Avoid placing throw rugs at the top or bottom of stairways, or properly secure the rug with carpet tape to prevent slippage. Get rid of throw rugs, if possible.  Have an electrician put in a light switch at the top and bottom of the stairs. OTHER FALL PREVENTION TIPS  Wear low-heel or rubber-soled shoes that are supportive and fit well. Wear closed toe shoes.  When using a stepladder, make sure it is fully opened and both spreaders are firmly locked. Do not climb a closed stepladder.  Add color or contrast paint or tape to grab bars and handrails in your home. Place contrasting color strips on first and last steps.  Learn and use mobility aids as needed. Install an electrical emergency response system.  Turn on lights to avoid dark areas. Replace light bulbs that burn out immediately. Get light switches that glow.  Arrange furniture to create clear pathways. Keep furniture in the same place.  Firmly attach carpet with non-skid or double-sided tape.  Eliminate uneven floor surfaces.  Select a carpet pattern that does not visually hide the edge of steps.  Be aware of all pets. OTHER HOME SAFETY TIPS  Set the water temperature for 120 F (48.8 C).  Keep emergency numbers on or near the telephone.  Keep smoke detectors on every level of the home and near sleeping areas. Document Released: 08/11/2002 Document Revised: 02/20/2012 Document Reviewed: 11/10/2011 [employer] Patient Information 2014  [employer]  

## 2013-06-19 ENCOUNTER — Ambulatory Visit (HOSPITAL_COMMUNITY): Payer: Medicare Other | Attending: Cardiology | Admitting: Radiology

## 2013-06-19 VITALS — BP 128/87 | Ht 74.0 in | Wt 214.0 lb

## 2013-06-19 DIAGNOSIS — R0989 Other specified symptoms and signs involving the circulatory and respiratory systems: Secondary | ICD-10-CM | POA: Insufficient documentation

## 2013-06-19 DIAGNOSIS — R0602 Shortness of breath: Secondary | ICD-10-CM

## 2013-06-19 DIAGNOSIS — I10 Essential (primary) hypertension: Secondary | ICD-10-CM | POA: Insufficient documentation

## 2013-06-19 DIAGNOSIS — R0609 Other forms of dyspnea: Secondary | ICD-10-CM | POA: Insufficient documentation

## 2013-06-19 MED ORDER — TECHNETIUM TC 99M SESTAMIBI GENERIC - CARDIOLITE
10.0000 | Freq: Once | INTRAVENOUS | Status: AC | PRN
  Administered 2013-06-19: 10 via INTRAVENOUS

## 2013-06-19 MED ORDER — REGADENOSON 0.4 MG/5ML IV SOLN
0.4000 mg | Freq: Once | INTRAVENOUS | Status: AC
  Administered 2013-06-19: 0.4 mg via INTRAVENOUS

## 2013-06-19 MED ORDER — TECHNETIUM TC 99M SESTAMIBI GENERIC - CARDIOLITE
30.0000 | Freq: Once | INTRAVENOUS | Status: AC | PRN
  Administered 2013-06-19: 30 via INTRAVENOUS

## 2013-06-19 NOTE — Progress Notes (Signed)
[facility] 3 NUCLEAR MED [address] [place], [place] [address] [phone]    Cardiology Nuclear Med Study  Brian Villegas is a 67 y.o. male     MRN : [id number]     DOB: 1946-05-02  Procedure Date: 06/19/2013  Nuclear Med Background Indication for Stress Test:  Evaluation for Ischemia History:  Cath>EF=55%,normal;'11 MPS:EF=49%,normal;Echo:EF=55-60% Cardiac Risk Factors: Hypertension  Symptoms:  DOE   Nuclear Pre-Procedure Caffeine/Decaff Intake:  None NPO After: 7:00pm   Lungs:  clear O2 Sat: 94% on room air. IV 0.9% NS with Angio Cath:  20g  IV Site: R Hand  IV Started by:  Cathlyn Parsons, RN  Chest Size (in):  44 Cup Size: n/a  Height: 6\' 2"  (1.88 m)  Weight:  214 lb (97.07 kg)  BMI:  Body mass index is 27.46 kg/(m^2). Tech Comments:  n/a    Nuclear Med Study 1 or 2 day study: 1 day  Stress Test Type:  Treadmill/Lexiscan  Reading MD: [place]  Order Authorizing Provider:  Villa Herb  Resting Radionuclide: Technetium 47m Sestamibi  Resting Radionuclide Dose: 11.0 mCi   Stress Radionuclide:  Technetium 99m Sestamibi  Stress Radionuclide Dose:33.0 mCi           Stress Protocol Rest HR: 77 Stress HR: 117  Rest BP: 128/87 Stress BP: 150/88  Exercise Time (min): 2:00 METS: 1.6   Predicted Max HR: 152 bpm % Max HR: 76.97 bpm Rate Pressure Product: 46962   Dose of Adenosine (mg):  n/a Dose of Lexiscan: 0.4 mg  Dose of Atropine (mg): n/a Dose of Dobutamine: n/a mcg/kg/min (at max HR)  Stress Test Technologist: Cathlyn Parsons, RN  Nuclear Technologist:  Domenic Polite, CNMT     Rest Procedure:  Myocardial perfusion imaging was performed at rest 45 minutes following the intravenous administration of Technetium 65m Sestamibi. Rest ECG: NSR - Normal EKG  Stress Procedure:  The patient received IV Lexiscan 0.4 mg over 15-seconds with concurrent low level exercise and then Technetium 12m Sestamibi was injected at 30-seconds  while the patient continued walking one more minute.  Quantitative spect images were obtained after a 45-minute delay. Stress ECG: No significant change from baseline ECG  QPS Raw Data Images:  Normal; no motion artifact; normal heart/lung ratio. Stress Images:  Normal homogeneous uptake in all areas of the myocardium. Rest Images:  Normal homogeneous uptake in all areas of the myocardium. Subtraction (SDS):  No evidence of ischemia. Transient Ischemic Dilatation (Normal <1.22):  1.03 Lung/Heart Ratio (Normal <0.45):  0.31  Quantitative Gated Spect Images QGS EDV:  102 ml QGS ESV:  43 ml  Impression Exercise Capacity:  Lexiscan with no exercise. BP Response:  Normal blood pressure response. Clinical Symptoms:  No significant symptoms noted. ECG Impression:  No significant ST segment change suggestive of ischemia. Comparison with Prior Nuclear Study: No significant change from previous study  Overall Impression:  Normal stress nuclear study.  LV Ejection Fraction: 53%.  LV Wall Motion:  NL LV Function; NL Wall Motion

## 2013-07-04 ENCOUNTER — Telehealth: Payer: Self-pay | Admitting: Neurology

## 2013-07-04 NOTE — Telephone Encounter (Signed)
Called pt to schedule appointment for Brian Villegas for an EEG appt Dr Dohmeier  Left message on machine for patient to call back.

## 2013-07-08 ENCOUNTER — Ambulatory Visit: Payer: Medicare Other | Admitting: Cardiology

## 2013-07-09 ENCOUNTER — Institutional Professional Consult (permissible substitution): Payer: Medicare Other | Admitting: Neurology

## 2013-07-16 ENCOUNTER — Ambulatory Visit (INDEPENDENT_AMBULATORY_CARE_PROVIDER_SITE_OTHER): Payer: Medicare Other

## 2013-07-16 DIAGNOSIS — F09 Unspecified mental disorder due to known physiological condition: Secondary | ICD-10-CM

## 2013-07-16 DIAGNOSIS — G4710 Hypersomnia, unspecified: Secondary | ICD-10-CM

## 2013-07-16 DIAGNOSIS — R4701 Aphasia: Secondary | ICD-10-CM

## 2013-07-16 DIAGNOSIS — R4189 Other symptoms and signs involving cognitive functions and awareness: Secondary | ICD-10-CM

## 2013-07-22 ENCOUNTER — Telehealth: Payer: Self-pay | Admitting: Neurology

## 2013-07-22 NOTE — Procedures (Signed)
    History:  Brian Villegas is a 67 year old gentleman with a history of a postconcussive syndrome associated with severe headaches, and difficulty staying awake, and memory loss. The patient also has some problems with confusion. The patient is being evaluated for the above issues.  This is a routine EEG. No skull defects are noted. Medications include allopurinol, Xanax, AndroGel, Wellbutrin, vitamin B12, diltiazem, Lamictal, Cozaar, Protonix, Ultram, and Ambien.   EEG classification: Normal awake  Description of the recording: The background rhythms of this recording consists of a fairly well modulated medium amplitude alpha rhythm of 9 Hz that is reactive to eye opening and closure. As the record progresses, the patient appears to remain in the waking state throughout the recording. Photic stimulation was performed, resulting in a bilateral and symmetric photic driving response. Hyperventilation was also performed, resulting in a minimal buildup of the background rhythm activities without significant slowing seen. At no time during the recording does there appear to be evidence of spike or spike wave discharges or evidence of focal slowing. EKG monitor shows no evidence of cardiac rhythm abnormalities with a heart rate of 78.  Impression: This is a normal EEG recording in the waking state. No evidence of ictal or interictal discharges are seen.

## 2013-07-22 NOTE — Telephone Encounter (Signed)
Informed patient that results has not been reviewed by physician as of yet, will call back

## 2013-07-25 ENCOUNTER — Ambulatory Visit (INDEPENDENT_AMBULATORY_CARE_PROVIDER_SITE_OTHER): Payer: Medicare Other | Admitting: Neurology

## 2013-07-25 ENCOUNTER — Encounter: Payer: Self-pay | Admitting: Neurology

## 2013-07-25 VITALS — BP 120/74 | HR 80 | Ht 74.0 in | Wt 216.1 lb

## 2013-07-25 DIAGNOSIS — R269 Unspecified abnormalities of gait and mobility: Secondary | ICD-10-CM

## 2013-07-25 DIAGNOSIS — R42 Dizziness and giddiness: Secondary | ICD-10-CM

## 2013-07-25 DIAGNOSIS — G44309 Post-traumatic headache, unspecified, not intractable: Secondary | ICD-10-CM

## 2013-07-25 MED ORDER — NORTRIPTYLINE HCL 10 MG PO CAPS: ORAL_CAPSULE | ORAL | Status: DC

## 2013-07-25 NOTE — Progress Notes (Signed)
faxed

## 2013-07-25 NOTE — Patient Instructions (Addendum)
1.  Start nortripytline as follows:  Week 1:  Take one tablet at bedtime  Week 2:  Take 2 tablets at bedtime  Week 3:  Take 3 tablets at bedtime  Week 4:  Take 4 tablets at bedtime 2.  Physical therapy referral for balance and gait training 3. Return to clinic in 71-month

## 2013-07-25 NOTE — Progress Notes (Signed)
[facility] Neurology [facility] Clinic Note - Initial Visit   Date: 07/25/2013    Brian Villegas MRN: [id number] DOB: Aug 31, 1946   Dear Dr Link Snuffer:  Thank you for your kind referral of JAMONI Villegas for consultation of headaches. Although his history is well known to you, please allow [place] to reiterate it for the purpose of our medical record. The patient was accompanied to the clinic by self.   History of Present Illness: Brian Villegas is a 67 y.o. year-old left-handed Caucasian male with history of hypertension, vitamin B12 deficiency, GERD, peripheral neuropathy, and polycythemia presenting for evaluation of headaches.    On May 30th 2014, he had a fall while walking to the bathroom at [facility] and was found by his wife on the bathroom floor confused and with a bleeding nose because he fractured his nose.  He was taken to the emergency department where CT head was normal.  Since then, he developed bifacial and bifrontal pressure sensation as if his "head is in a vice".  Pain is better in the morning and worse in the afternoon and evening. He also reports having short-term memory loss especially when the pain is severe. He has daily headaches, lasting about 8 hours.  He tries to lay down in the afternoon and says that he has "a different sleeping pattern" where he is in and out of sleep.  He has not noticed any triggers.  He has mild nausea and dizziness with the headaches.   Denies any vomiting, photophobia, phonophobia.  He tried lamictal 25mg , tylenol, and advil.   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  He has fallen twice this year because he feels unsteady.  He previously was very active playing sports and running half-marathons up until 5-years ago, when his gait and balance become more of an issue. He reports having a number of neurological problems over the years including peripheral  neuropathy, leg pain, facial numbness, cervical fusion x 3, and concussion.  He was seen by Dr. Sandria Manly in 2007 and [facility] ~2003 with no conclusive findings.  Patient was recently seen by Dr. Vickey Huger on October 15th for the same symptoms.  EEG was performed and was normal.  He is scheduled to have a sleep study.  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   Out-side paper records, electronic medical record, and images have been reviewed where available and summarized as:  EEG 07/22/2013:   This is a normal EEG recording in the waking state.  No evidence of ictal or interictal discharges are seen.    MRA brain 05/08/2013: 2 mm aneurysm arising from the medial aspect of the left internal carotid artery cavernous segment is unchanged.  Progressive intracranial atherosclerotic type changes as detailed above. Most significant change has occurred involving the middle cerebral artery proximal branch vessels  CT head 01/31/2013: Technique: Contiguous axial images were obtained from the base of the skull through the vertex without contrast  Findings: Bone windows demonstrate no scalp soft tissue swelling. Mucosal thickening ethmoid air cells. Clear mastoid air cells.  Soft tissue windows demonstrate no mass lesion, hemorrhage, hydrocephalus, acute infarct, intra-axial, or extra-axial fluid  collection.  IMPRESSION:  No acute intracranial abnormality.   MRA brain 2009: 1. Stable, intracranial MRA.  2. Small 2-3 mm saccular outpouching of the left supraclinoid ICA  is unchanged since 2007 and could represent a small superior  hypophyseal aneurysm or infundibulum.  3. No significant intracranial stenosis. Previously suspected left ICA anterior genu stenosis is artifactual due to a pneumatized  left clinoid process.   MRA neck 2009: 1. Stable neck MRA. The study is unremarkable aside from a tortuous proximal left vertebral artery.    2. See head MRA findings below.    MRI c-spine 2009: 1. Mild progression of spinal stenosis and 2007 at C4-C5 as detailed above. Mild flattening of the ventral cord surface without cord signal abnormality.  2. Stable C5-C6 interbody fusion, no adverse features.  3. Otherwise stable overall mild degenerative changes for age.    Past Medical History  Diagnosis Date  . Hypertension     multiple medication intolerances  . Polycythemia     requires phlebotomies   . Alcohol consuption of more than two drinks per day   . Memory loss   . Concussion with loss of consciousness 06/18/2013    Past Surgical History  Procedure Laterality Date  . US echocardiography  2008    EF 50-55% , trivial AR,hypokinesis of mid inferior wall  . Nuclear stress test  08/2010    EF 49%, Normal  . Cardiac catheterization  2008  . Cervical discectomy  2010  . Appendectomy    . Left shoulder surgery       Medications:  Current Outpatient Prescriptions on File Prior to Visit  Medication Sig Dispense Refill  . ALPRAZolam (XANAX) 0.5 MG tablet Take 1 tablet by mouth as needed for anxiety.       . cyanocobalamin (,VITAMIN B-12,) 1000 MCG/ML injection Inject 100 mcg into the muscle every 30 (thirty) days.       [name] diltiazem (CARDIZEM CD) 120 MG 24 hr capsule Take 1 capsule (120 mg total) by mouth as directed. 1 daily in the evening  30 capsule  0  . losartan (COZAAR) 25 MG tablet Take 25 mg by mouth daily.      . pantoprazole (PROTONIX) 20 MG tablet Take 20 mg by mouth daily.      [name] zolpidem (AMBIEN) 10 MG tablet Take 10 mg by mouth at bedtime as needed for sleep.        No current facility-administered medications on file prior to visit.    Allergies:  Allergies  Allergen Reactions  . Amlodipine   . Bystolic [Nebivolol Hcl]   . Codeine Itching and Nausea And Vomiting  . Lisinopril     cough  . Losartan Other (See Comments)    Fatigue, felt bad Nausea and dizziness  . Micardis Hct  [Telmisartan-Hctz]     Cough, anxious  . Olmesartan Other (See Comments)  . Toprol Xl [Metoprolol Succinate]   . Triamterene     Family History: Family History  Problem Relation Age of Onset  . Other Mother     Died, 31 complicated from esophageal stricutre  . Other Father     Died, 21 from carbon monoxide poisoning  . Healthy Brother   . Healthy Daughter     Social History: History   Social History  . Marital Status: Married    Spouse Name: N/A    Number of Children: N/A  . Years of Education: N/A   Occupational History  . Not on file.   Social History Main Topics  . Smoking status: Never Smoker   . Smokeless tobacco: Not on file  . Alcohol Use: Yes     Comment:  1 shot of vodka nightly x 20years  . Drug Use: No  . Sexual Activity: No   Other Topics Concern  . Not on file   Social History Narrative   Pt is retired lives with wife of 17 yrs 2 grown children   Previously worked as a Air cabin crew.    Review of Systems:  CONSTITUTIONAL: No fevers, chills, night sweats, or weight loss.   EYES: No visual changes or eye pain ENT: No hearing changes.  No history of nose bleeds.   RESPIRATORY: No cough, wheezing and shortness of breath.   CARDIOVASCULAR: Negative for chest pain, and palpitations.   GI: Negative for abdominal discomfort, blood in stools or black stools.  No recent change in bowel habits.   GU:  No history of incontinence.   MUSCLOSKELETAL: No history of joint pain or swelling.  No myalgias.   SKIN: Negative for lesions, rash, and itching.   HEMATOLOGY/ONCOLOGY: Negative for prolonged bleeding, bruising easily, and swollen nodes.    ENDOCRINE: Negative for cold or heat intolerance, polydipsia or goiter.   PSYCH:  No depression or anxiety symptoms.   NEURO: As Above.   Vital Signs:  BP 120/74  Pulse 80  Ht 6\' 2"  (1.88 m)  Wt 216 lb 1.6 oz (98.022 kg)  BMI 27.73 kg/m2   Neurological Exam: MENTAL STATUS including orientation to time, place,  person, recent and remote memory, attention span and concentration, language, and fund of knowledge is normal.  Speech is not dysarthric.  CRANIAL NERVES: II:  No visual field defects.  Unremarkable fundi.   III-IV-VI: Pupils equal round and reactive to light.  Normal conjugate, extra-ocular eye movements in all directions of gaze.  No nystagmus.  No ptosis. Jerky saccades and pursuit.   V:  Normal facial sensation.  Jaw jerk is absent.   VII:  Normal facial symmetry and movements.  Bilateral palmomental reflex (R >L), no snout or Myerso'ns sign. VIII:  Reduced hearing to finger rub bilaterally.     IX-X:  Normal palatal movement.   XI:  Normal shoulder shrug and head rotation.   XII:  Normal tongue strength and range of motion, no deviation or fasciculation.  MOTOR:  No atrophy, fasciculations or abnormal movements.  No pronator drift.  Tone is normal.    Right Upper Extremity:    Left Upper Extremity:    Deltoid  5/5   Deltoid  5/5   Biceps  5/5   Biceps  5/5   Triceps  5/5   Triceps  5/5   Wrist extensors  5/5   Wrist extensors  5/5   Wrist flexors  5/5   Wrist flexors  5/5   Finger extensors  5/5   Finger extensors  5/5   Finger flexors  5/5   Finger flexors  5/5   Dorsal interossei  5/5   Dorsal interossei  5/5   Abductor pollicis  5/5   Abductor pollicis  5/5   Tone (Ashworth scale)  0  Tone (Ashworth scale)  0   Right Lower Extremity:    Left Lower Extremity:    Hip flexors  5/5   Hip flexors  5/5   Hip extensors  5/5   Hip extensors  5/5   Knee flexors  5/5   Knee flexors  5/5   Knee extensors  5/5   Knee extensors  5/5   Dorsiflexors  5/5   Dorsiflexors  5/5   Plantarflexors  5/5   Plantarflexors  5/5  Toe extensors  5/5   Toe extensors  5/5   Toe flexors  5/5   Toe flexors  5/5   Tone (Ashworth scale)  0  Tone (Ashworth scale)  0   MSRs:  Right                                                                 Left brachioradialis 3+  brachioradialis 3+  biceps 3+   biceps 3+  triceps 3+  triceps 3+  patellar 2+  patellar 2+  ankle jerk 1+  ankle jerk 1+  Hoffman no  Hoffman no  plantar response down  plantar response down   SENSORY:  Absent vibration at the great toe bilaterally, otherwise normal and symmetric perception of light touch, pinprick, and proprioception.  Romberg's sign present.   COORDINATION/GAIT: Normal finger-to- nose-finger and heel-to-shin.  Intact rapid alternating movements bilaterally.  Able to rise from a chair without using arms.  Gait slightly wide-based based but appears stable. Unsteady with tandem.  He is unable to perform stressed gait.    IMPRESSION: Mr. Maish is a 67 year-old gentleman presenting for evaluation of headaches following a fall (May 2014).  I have reviewed previous work-up including MRI/A brain which shows stable 2mm LICA aneursym at the cavernous segment.  His neurological examination shows symmetrically brisk reflexes without associated upper motor neuron findings which is most likely due to known cervical disease.  Diminished vibration at the great toe is most likely age-related, however a very mild distal predominant large fiber neuropathy cannot be excluded.  He tells me that had a normal EMG at the [facility].   Based on his history and exam, he has post-concussion tension headaches.  I discussed that majority of individuals who suffer concussion improve within 74-months, however some individual may see persistent symptoms even up to 1+ year after the event. Given that his headaches seem to be his most bothersome symptom, management will be to optimize pain control.       PLAN/RECOMMENDATIONS:  1.  Start nortriptyline 10mg  and increase by 10mg  each week to a goal of 50mg  daily 2.  Start physical therapy for balance and gait training 3.  Psychotherapy may be considered going forward 4.  Return to clinic in 65-month or sooner as needed  The duration of this appointment visit was 60 minutes of face-to-face  time with the patient.  Greater than 50% of this time was spent in counseling, explanation of diagnosis, planning of further management, and coordination of care.   Thank you for allowing me to participate in patient's care.  If I can answer any additional questions, I would be pleased to do so.    Sincerely,    Royston Bekele K. Allena Katz, DO

## 2013-07-30 NOTE — Telephone Encounter (Signed)
Patient has been called w/ normal EEG results

## 2013-08-05 ENCOUNTER — Telehealth: Payer: Self-pay | Admitting: *Deleted

## 2013-08-05 NOTE — Telephone Encounter (Addendum)
Called patient just to follow up on referral for PT and patient states that he has not stared taking it yet because he is still have very server headaches. He states that the nortriptyline was not helping. Patient also stated the he would still do the PT for balance nd gait traing one the got the headaches under control

## 2013-08-19 ENCOUNTER — Ambulatory Visit: Payer: Medicare Other | Admitting: Nurse Practitioner

## 2013-08-27 ENCOUNTER — Other Ambulatory Visit: Payer: Self-pay | Admitting: Neurological Surgery

## 2013-08-27 DIAGNOSIS — M542 Cervicalgia: Secondary | ICD-10-CM

## 2013-08-29 ENCOUNTER — Other Ambulatory Visit: Payer: Medicare Other

## 2013-08-29 ENCOUNTER — Ambulatory Visit
Admission: RE | Admit: 2013-08-29 | Discharge: 2013-08-29 | Disposition: A | Discharge status: Home / Self Care | Payer: Medicare Other | Source: Ambulatory Visit | Attending: Neurological Surgery | Admitting: Neurological Surgery

## 2013-08-29 DIAGNOSIS — M542 Cervicalgia: Secondary | ICD-10-CM

## 2013-09-05 ENCOUNTER — Other Ambulatory Visit: Payer: Self-pay

## 2013-09-05 MED ORDER — DILTIAZEM HCL ER COATED BEADS 120 MG PO CP24: 120.0000 mg | ORAL_CAPSULE | ORAL | Status: DC

## 2013-09-05 MED ORDER — LOSARTAN POTASSIUM 25 MG PO TABS: 25.0000 mg | ORAL_TABLET | Freq: Every day | ORAL | Status: DC

## 2013-09-09 ENCOUNTER — Encounter: Payer: Self-pay | Admitting: Neurology

## 2013-09-09 ENCOUNTER — Ambulatory Visit (INDEPENDENT_AMBULATORY_CARE_PROVIDER_SITE_OTHER): Payer: Medicare Other | Admitting: Neurology

## 2013-09-09 VITALS — BP 130/80 | HR 72 | Temp 97.7°F | Ht 74.0 in | Wt 221.0 lb

## 2013-09-09 DIAGNOSIS — G4440 Drug-induced headache, not elsewhere classified, not intractable: Secondary | ICD-10-CM

## 2013-09-09 DIAGNOSIS — R269 Unspecified abnormalities of gait and mobility: Secondary | ICD-10-CM

## 2013-09-09 DIAGNOSIS — R519 Headache, unspecified: Secondary | ICD-10-CM

## 2013-09-09 DIAGNOSIS — R51 Headache: Secondary | ICD-10-CM

## 2013-09-09 DIAGNOSIS — G44309 Post-traumatic headache, unspecified, not intractable: Secondary | ICD-10-CM

## 2013-09-09 DIAGNOSIS — I671 Cerebral aneurysm, nonruptured: Secondary | ICD-10-CM

## 2013-09-09 NOTE — Patient Instructions (Addendum)
We will place a referral to Dr. Orie Rout in the [facility]. Their office will call you directly with an appointment. If you don't hear from them they can be contacted at [phone]. If you have any questions, you can always contact us back.   Return to clinic in 66-months

## 2013-09-09 NOTE — Progress Notes (Signed)
Follow-up Visit   Date: 09/09/2013    Brian Villegas MRN: [id number] DOB: 01-02-1946   Interim History: Brian Villegas is a 68 y.o. right-handed Caucasian male with history of hypertension, vitamin B12 deficiency, GERD, peripheral neuropathy, and polycythemia returning to the clinic for post-concussion headaches.  He was last seen in the office on 07/25/2013.  At that visit, he was started on nortriptyline, but stopped it after three weeks because he does not feel that antidepressants will help with headaches.  He continues to have severe headaches and takes aspirin and tylenol migraine with does not help, but he takes it daily. He does not want to try other TCA, SNRIs, SSRIs, beta-blockers, depakote or topamax.  He had nerve blocks in the past with little benefit.  He saw Dr. Ronnald Ramp, his neurosurgeon, for worsening neck pain who obtained CT imaging of the neck and is scheduled to follow-up this Friday.    History of present illness: On May 30th 2014, he had a fall while walking to the bathroom at [facility] and was found by his wife on the bathroom floor confused and with a bleeding nose because he fractured his nose. He was taken to the emergency department where CT head was normal. Since then, he developed bifacial and bifrontal pressure sensation as if his "head is in a vice". Pain is better in the morning and worse in the afternoon and evening. He also reports having short-term memory loss especially when the pain is severe. Headahces are daily, lasting about 8 hours. He has mild nausea and dizziness with the headaches. He tried lamictal 25mg , tylenol, and advil.   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 He has fallen twice this year because he feels unsteady. He previously was very active playing sports and running half-marathons up until 5-years ago, when his gait and balance become more of an issue.  He reports having a number of neurological problems over the years including peripheral neuropathy, leg pain, facial numbness, cervical fusion x 3, and concussion. He was seen by Dr. Erling Cruz in 2007 and [facility] ~2003 with no conclusive findings.   Patient was recently seen by Dr. Brett Fairy on October 15th for the same symptoms. EEG was performed and was normal. He is scheduled to have a sleep study. 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    Medications:  Current Outpatient Prescriptions on File Prior to Visit  Medication Sig Dispense Refill  . ALPRAZolam (XANAX) 0.5 MG tablet Take 1 tablet by mouth as needed for anxiety.       . cyanocobalamin (,VITAMIN B-12,) 1000 MCG/ML injection Inject 100 mcg into the muscle every 30 (thirty) days.       [name] diltiazem (CARDIZEM CD) 120 MG 24 hr capsule Take 1 capsule (120 mg total) by mouth as directed. 1 daily in the evening  30 capsule  6  . losartan (COZAAR) 25 MG tablet Take 1 tablet (25 mg total) by mouth daily.  60 tablet  6  . pantoprazole (PROTONIX) 20 MG tablet Take 20 mg by mouth daily.      [name] zolpidem (AMBIEN) 10 MG tablet Take 10 mg by mouth at bedtime as needed for sleep.        No current facility-administered medications on file prior to visit.    Allergies:  Allergies  Allergen Reactions  . Amlodipine   . Bystolic [Nebivolol Hcl]   . Codeine Itching and Nausea And Vomiting  . Lisinopril     cough  . Losartan Other (See Comments)    Fatigue, felt bad Nausea and dizziness  . Micardis Hct [Telmisartan-Hctz]     Cough, anxious  . Olmesartan Other (See Comments)  . Toprol Xl [Metoprolol Succinate]   . Triamterene      Review of Systems:  CONSTITUTIONAL: No fevers, chills, night sweats, or weight loss.   EYES: No visual changes or eye pain ENT: No hearing changes.  No history of nose bleeds.   RESPIRATORY: No cough, wheezing and shortness of breath.     CARDIOVASCULAR: Negative for chest pain, and palpitations.   GI: Negative for abdominal discomfort, blood in stools or black stools.  No recent change in bowel habits.   GU:  No history of incontinence.   MUSCLOSKELETAL: No history of joint pain or swelling.  No myalgias.   SKIN: Negative for lesions, rash, and itching.   ENDOCRINE: Negative for cold or heat intolerance, polydipsia or goiter.   PSYCH:  No depression or anxiety symptoms.   NEURO: As Above.   Vital Signs:  BP 130/80  Pulse 72  Temp(Src) 97.7 F (36.5 C) (Oral)  Ht  (1.88 m)  Wt 221 lb (100.245 kg)  BMI 28.36 kg/m2  Neurological Exam: MENTAL STATUS including orientation to time, place, person, recent and remote memory, attention span and concentration, language, and fund of knowledge is normal.  Speech is not dysarthric.  CRANIAL NERVES: No visual field defects. Pupils equal round and reactive to light.  Normal conjugate, extra-ocular eye movements in all directions of gaze. Jerky saccades and pursuit. Jaw jerk is absent.  No ptosis. Normal facial sensation.  Face is symmetric. Palate elevates symmetrically.  Tongue is midline.  MOTOR:  Motor strength is 5/5 in all extremities.  No pronator drift.  Tone is normal.    MSRs:  Right      Left  brachioradialis  3+   brachioradialis  3+   biceps  3+   biceps  3+   triceps  3+   triceps  3+   patellar  2+   patellar  2+   ankle jerk  1+   ankle jerk  1+   Hoffman  no   Hoffman  no   plantar response  down   plantar response  down    SENSORY:  Absent vibration at the great toe bilaterally, otherwise normal and symmetric perception of light touch, pinprick, and proprioception.   COORDINATION/GAIT:  Normal finger-to- nose-finger and heel-to-shin.  Intact rapid alternating movements bilaterally.  Gait slightly wide-based based but appears stable. Unsteady with tandem. He is unable to perform stressed gait.   Data: CT cervical spine 08/29/2013: 1. Slight  progression of degenerative disc disease at C6-7 with no neural impingement.  2. Slight progression of mild degenerative facet arthritis at C2-3 on the left.  EEG 07/22/2013:  Normal  MRI/A brain 05/08/2013: 2 mm aneurysm arising from the medial aspect of the left internal carotid artery cavernous segment is unchanged. Progressive intracranial atherosclerotic type changes as detailed above. Most significant change has occurred involving the middle cerebral artery proximal branch vessels   CT head 01/31/2013: No acute intracranial abnormality.   MRA brain 2009:  1. Stable, intracranial MRA.  2. Small 2-3 mm saccular outpouching of the left supraclinoid ICA is unchanged since 2007 and could represent a small  superior hypophyseal aneurysm or infundibulum.  3. No significant intracranial stenosis. Previously suspected left ICA anterior genu stenosis is artifactual due to a pneumatized left clinoid process.   MRA neck 2009:  1. Stable neck MRA. The study is unremarkable aside from a tortuous proximal left vertebral artery.  2. See head MRA findings below.   MRI c-spine 2009:  1. Mild progression of spinal stenosis and 2007 at C4-C5 as detailed above. Mild flattening of the ventral cord surface without cord signal abnormality.  2. Stable C5-C6 interbody fusion, no adverse features.  3. Otherwise stable overall mild degenerative changes for age.    IMPRESSION/PLAN: Post-concussoin headaches Chronic daily headaches Medication overuse headaches - Headaches started following a fall in May 2014 - MRI/A brain which shows stable 55mm LICA aneursym at the cavernous segment.  - Previously tried lamictal, nortriptyline, NSAIDs.  He is taking aspirin and tylenol daily without any benefit. - I had extensive discussion regarding alternative options for headache management including SSRIs, SNRIs, seizure medications (topamax, depakote, keppra), muscle relaxants, nerve blocks, and botox but he does not favor  trying medications to see if they will work and prefers a more definitive treatment option.  I would not use triptan given his known LICA aneurysm.   - Unfortunately, I do not have much else to offer in terms of pain management Cervical stenosis s/p cervical fusion x 3 - Exam with symmetrically brisk reflexes without associated upper motor neuron findings which is most likely due to known cervical disease - Followed by Dr. 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  Physical therapy for gait training 2.  Refer patient to [place]  3.  Return to clinic in 51-months   The duration of this appointment visit was 25 minutes of face-to-face time with the patient.  Greater than 50% of this time was spent in counseling, explanation of diagnosis, planning of further management, and coordination of care.   Thank you for allowing me to participate in patient's care.  If I can answer any additional questions, I would be pleased to do so.    Sincerely,    Donika K. Posey Pronto, DO

## 2013-09-29 ENCOUNTER — Ambulatory Visit: Payer: Medicare Other | Attending: Internal Medicine | Admitting: Physical Therapy

## 2013-09-29 DIAGNOSIS — R269 Unspecified abnormalities of gait and mobility: Secondary | ICD-10-CM | POA: Insufficient documentation

## 2013-09-29 DIAGNOSIS — R51 Headache: Secondary | ICD-10-CM | POA: Insufficient documentation

## 2013-09-29 DIAGNOSIS — IMO0001 Reserved for inherently not codable concepts without codable children: Secondary | ICD-10-CM | POA: Insufficient documentation

## 2013-10-06 ENCOUNTER — Ambulatory Visit: Payer: Medicare Other | Attending: Internal Medicine | Admitting: Physical Therapy

## 2013-10-06 DIAGNOSIS — R51 Headache: Secondary | ICD-10-CM | POA: Insufficient documentation

## 2013-10-06 DIAGNOSIS — R269 Unspecified abnormalities of gait and mobility: Secondary | ICD-10-CM | POA: Insufficient documentation

## 2013-10-06 DIAGNOSIS — IMO0001 Reserved for inherently not codable concepts without codable children: Secondary | ICD-10-CM | POA: Insufficient documentation

## 2013-10-08 ENCOUNTER — Ambulatory Visit: Payer: Medicare Other | Admitting: Physical Therapy

## 2013-11-07 ENCOUNTER — Ambulatory Visit: Payer: Medicare Other | Attending: Internal Medicine | Admitting: Physical Therapy

## 2013-11-07 DIAGNOSIS — IMO0001 Reserved for inherently not codable concepts without codable children: Secondary | ICD-10-CM | POA: Insufficient documentation

## 2013-11-07 DIAGNOSIS — R51 Headache: Secondary | ICD-10-CM | POA: Insufficient documentation

## 2013-11-07 DIAGNOSIS — R269 Unspecified abnormalities of gait and mobility: Secondary | ICD-10-CM | POA: Insufficient documentation

## 2013-11-11 ENCOUNTER — Ambulatory Visit: Payer: Medicare Other | Admitting: Physical Therapy

## 2014-02-12 ENCOUNTER — Emergency Department (HOSPITAL_COMMUNITY)
Admission: EM | Admit: 2014-02-12 | Discharge: 2014-02-12 | Disposition: A | Discharge status: Home / Self Care | Payer: Medicare Other | Source: Home / Self Care

## 2014-02-12 ENCOUNTER — Encounter (HOSPITAL_BASED_OUTPATIENT_CLINIC_OR_DEPARTMENT_OTHER): Payer: Self-pay | Admitting: Emergency Medicine

## 2014-02-12 ENCOUNTER — Inpatient Hospital Stay (HOSPITAL_BASED_OUTPATIENT_CLINIC_OR_DEPARTMENT_OTHER)
Admission: EM | Admit: 2014-02-12 | Discharge: 2014-02-15 | DRG: 440 | Disposition: A | Discharge status: Home / Self Care | Payer: Medicare Other | Attending: Internal Medicine | Admitting: Internal Medicine

## 2014-02-12 ENCOUNTER — Emergency Department (HOSPITAL_BASED_OUTPATIENT_CLINIC_OR_DEPARTMENT_OTHER): Payer: Medicare Other

## 2014-02-12 DIAGNOSIS — E559 Vitamin D deficiency, unspecified: Secondary | ICD-10-CM | POA: Diagnosis present

## 2014-02-12 DIAGNOSIS — D7589 Other specified diseases of blood and blood-forming organs: Secondary | ICD-10-CM | POA: Diagnosis present

## 2014-02-12 DIAGNOSIS — I10 Essential (primary) hypertension: Secondary | ICD-10-CM | POA: Diagnosis present

## 2014-02-12 DIAGNOSIS — K859 Acute pancreatitis without necrosis or infection, unspecified: Principal | ICD-10-CM | POA: Diagnosis present

## 2014-02-12 DIAGNOSIS — Z961 Presence of intraocular lens: Secondary | ICD-10-CM

## 2014-02-12 DIAGNOSIS — K759 Inflammatory liver disease, unspecified: Secondary | ICD-10-CM | POA: Diagnosis present

## 2014-02-12 DIAGNOSIS — K7689 Other specified diseases of liver: Secondary | ICD-10-CM | POA: Diagnosis present

## 2014-02-12 DIAGNOSIS — Z9849 Cataract extraction status, unspecified eye: Secondary | ICD-10-CM

## 2014-02-12 DIAGNOSIS — E538 Deficiency of other specified B group vitamins: Secondary | ICD-10-CM | POA: Diagnosis present

## 2014-02-12 DIAGNOSIS — Z79899 Other long term (current) drug therapy: Secondary | ICD-10-CM

## 2014-02-12 DIAGNOSIS — D45 Polycythemia vera: Secondary | ICD-10-CM | POA: Diagnosis present

## 2014-02-12 DIAGNOSIS — I671 Cerebral aneurysm, nonruptured: Secondary | ICD-10-CM | POA: Diagnosis present

## 2014-02-12 DIAGNOSIS — F411 Generalized anxiety disorder: Secondary | ICD-10-CM | POA: Diagnosis present

## 2014-02-12 DIAGNOSIS — E876 Hypokalemia: Secondary | ICD-10-CM | POA: Diagnosis present

## 2014-02-12 DIAGNOSIS — K219 Gastro-esophageal reflux disease without esophagitis: Secondary | ICD-10-CM | POA: Diagnosis present

## 2014-02-12 DIAGNOSIS — Z85828 Personal history of other malignant neoplasm of skin: Secondary | ICD-10-CM

## 2014-02-12 DIAGNOSIS — Z981 Arthrodesis status: Secondary | ICD-10-CM

## 2014-02-12 DIAGNOSIS — E8809 Other disorders of plasma-protein metabolism, not elsewhere classified: Secondary | ICD-10-CM | POA: Diagnosis present

## 2014-02-12 HISTORY — DX: Headache: R51

## 2014-02-12 HISTORY — DX: Anxiety disorder, unspecified: F41.9

## 2014-02-12 HISTORY — DX: Constipation, unspecified: K59.00

## 2014-02-12 HISTORY — DX: Gastro-esophageal reflux disease without esophagitis: K21.9

## 2014-02-12 HISTORY — DX: Pure hypercholesterolemia, unspecified: E78.00

## 2014-02-12 HISTORY — DX: Basal cell carcinoma of skin, unspecified: C44.91

## 2014-02-12 HISTORY — DX: Inflammatory liver disease, unspecified: K75.9

## 2014-02-12 LAB — COMPREHENSIVE METABOLIC PANEL
ALBUMIN: 3.9 g/dL (ref 3.5–5.2)
ALT: 80 U/L — ABNORMAL HIGH (ref 0–53)
AST: 111 U/L — ABNORMAL HIGH (ref 0–37)
Alkaline Phosphatase: 102 U/L (ref 39–117)
BILIRUBIN TOTAL: 1.9 mg/dL — AB (ref 0.3–1.2)
BUN: 22 mg/dL (ref 6–23)
CO2: 23 mEq/L (ref 19–32)
CREATININE: 1.2 mg/dL (ref 0.50–1.35)
Calcium: 9.5 mg/dL (ref 8.4–10.5)
Chloride: 96 mEq/L (ref 96–112)
GFR calc Af Amer: 70 mL/min — ABNORMAL LOW (ref >90)
GFR calc non Af Amer: 60 mL/min — ABNORMAL LOW (ref >90)
Glucose, Bld: 148 mg/dL — ABNORMAL HIGH (ref 70–99)
Potassium: 3.6 mEq/L — ABNORMAL LOW (ref 3.7–5.3)
Sodium: 137 mEq/L (ref 137–147)
TOTAL PROTEIN: 7.1 g/dL (ref 6.0–8.3)

## 2014-02-12 LAB — URINALYSIS, ROUTINE W REFLEX MICROSCOPIC
Glucose, UA: NEGATIVE mg/dL
Hgb urine dipstick: NEGATIVE
KETONES UR: 15 mg/dL — AB
NITRITE: NEGATIVE
Protein, ur: 30 mg/dL — AB
SPECIFIC GRAVITY, URINE: 1.024 (ref 1.005–1.030)
Urobilinogen, UA: 1 mg/dL (ref 0.0–1.0)
pH: 6 (ref 5.0–8.0)

## 2014-02-12 LAB — CBC WITH DIFFERENTIAL/PLATELET
BASOS PCT: 0 % (ref 0–1)
Basophils Absolute: 0 10*3/uL (ref 0.0–0.1)
EOS PCT: 2 % (ref 0–5)
Eosinophils Absolute: 0.2 10*3/uL (ref 0.0–0.7)
HEMATOCRIT: 47.9 % (ref 39.0–52.0)
Hemoglobin: 17.6 g/dL — ABNORMAL HIGH (ref 13.0–17.0)
Lymphocytes Relative: 8 % — ABNORMAL LOW (ref 12–46)
Lymphs Abs: 1 10*3/uL (ref 0.7–4.0)
MCH: 37.5 pg — AB (ref 26.0–34.0)
MCHC: 36.7 g/dL — AB (ref 30.0–36.0)
MCV: 102.1 fL — ABNORMAL HIGH (ref 78.0–100.0)
MONO ABS: 1 10*3/uL (ref 0.1–1.0)
Monocytes Relative: 9 % (ref 3–12)
Neutro Abs: 9.5 10*3/uL — ABNORMAL HIGH (ref 1.7–7.7)
Neutrophils Relative %: 81 % — ABNORMAL HIGH (ref 43–77)
Platelets: 190 10*3/uL (ref 150–400)
RBC: 4.69 MIL/uL (ref 4.22–5.81)
RDW: 13.2 % (ref 11.5–15.5)
WBC: 11.7 10*3/uL — ABNORMAL HIGH (ref 4.0–10.5)

## 2014-02-12 LAB — I-STAT CG4 LACTIC ACID, ED: Lactic Acid, Venous: 2.54 mmol/L — ABNORMAL HIGH (ref 0.5–2.2)

## 2014-02-12 LAB — URINE MICROSCOPIC-ADD ON

## 2014-02-12 LAB — LIPASE, BLOOD: LIPASE: 2054 U/L — AB (ref 11–59)

## 2014-02-12 MED ORDER — SODIUM CHLORIDE 0.9 % IV SOLN
INTRAVENOUS | Status: DC
  Administered 2014-02-12 – 2014-02-15 (×7): via INTRAVENOUS

## 2014-02-12 MED ORDER — HEPARIN SODIUM (PORCINE) 5000 UNIT/ML IJ SOLN
5000.0000 [IU] | Freq: Three times a day (TID) | INTRAMUSCULAR | Status: DC
Start: 2014-02-12 — End: 2014-02-15
  Administered 2014-02-12 – 2014-02-14 (×7): 5000 [IU] via SUBCUTANEOUS
  Filled 2014-02-12 (×11): qty 1

## 2014-02-12 MED ORDER — HYDROMORPHONE HCL PF 1 MG/ML IJ SOLN
1.0000 mg | INTRAMUSCULAR | Status: DC | PRN
  Filled 2014-02-12 (×2): qty 1

## 2014-02-12 MED ORDER — ACETAMINOPHEN 650 MG RE SUPP: 650.0000 mg | Freq: Four times a day (QID) | RECTAL | Status: DC | PRN

## 2014-02-12 MED ORDER — FENTANYL CITRATE 0.05 MG/ML IJ SOLN
50.0000 ug | Freq: Once | INTRAMUSCULAR | Status: AC
  Administered 2014-02-12: 50 ug via INTRAVENOUS
  Filled 2014-02-12: qty 2

## 2014-02-12 MED ORDER — SODIUM CHLORIDE 0.9 % IV BOLUS (SEPSIS)
1000.0000 mL | Freq: Once | INTRAVENOUS | Status: AC
  Administered 2014-02-12: 1000 mL via INTRAVENOUS

## 2014-02-12 MED ORDER — ONDANSETRON HCL 4 MG/2ML IJ SOLN
4.0000 mg | Freq: Once | INTRAMUSCULAR | Status: AC
  Administered 2014-02-12: 4 mg via INTRAVENOUS
  Filled 2014-02-12: qty 2

## 2014-02-12 MED ORDER — ONDANSETRON HCL 4 MG/2ML IJ SOLN
INTRAMUSCULAR | Status: AC
  Administered 2014-02-12: 4 mg via INTRAVENOUS
  Filled 2014-02-12: qty 2

## 2014-02-12 MED ORDER — LORAZEPAM 2 MG/ML IJ SOLN
0.5000 mg | INTRAMUSCULAR | Status: DC | PRN
  Administered 2014-02-12: 0.5 mg via INTRAVENOUS
  Filled 2014-02-12 (×2): qty 1

## 2014-02-12 MED ORDER — HYDROMORPHONE HCL PF 1 MG/ML IJ SOLN
1.0000 mg | Freq: Once | INTRAMUSCULAR | Status: AC
  Administered 2014-02-12: 1 mg via INTRAVENOUS
  Filled 2014-02-12: qty 1

## 2014-02-12 MED ORDER — FENTANYL CITRATE 0.05 MG/ML IJ SOLN
12.5000 ug | INTRAMUSCULAR | Status: DC | PRN
Start: 2014-02-12 — End: 2014-02-15
  Administered 2014-02-12 (×2): 25 ug via INTRAVENOUS
  Administered 2014-02-13: 12.5 ug via INTRAVENOUS
  Administered 2014-02-13 (×3): 25 ug via INTRAVENOUS
  Administered 2014-02-13 – 2014-02-15 (×8): 12.5 ug via INTRAVENOUS
  Filled 2014-02-12 (×14): qty 2

## 2014-02-12 MED ORDER — PROMETHAZINE HCL 25 MG/ML IJ SOLN
12.5000 mg | Freq: Once | INTRAMUSCULAR | Status: AC
  Administered 2014-02-12: 12.5 mg via INTRAVENOUS
  Filled 2014-02-12: qty 1

## 2014-02-12 MED ORDER — PANTOPRAZOLE SODIUM 40 MG IV SOLR
40.0000 mg | INTRAVENOUS | Status: DC
  Administered 2014-02-12 – 2014-02-14 (×3): 40 mg via INTRAVENOUS
  Filled 2014-02-12 (×4): qty 40

## 2014-02-12 MED ORDER — ONDANSETRON HCL 4 MG/2ML IJ SOLN
4.0000 mg | Freq: Three times a day (TID) | INTRAMUSCULAR | Status: AC | PRN
  Filled 2014-02-12 (×2): qty 2

## 2014-02-12 MED ORDER — ONDANSETRON HCL 4 MG/2ML IJ SOLN
4.0000 mg | Freq: Once | INTRAMUSCULAR | Status: AC
  Administered 2014-02-12: 4 mg via INTRAVENOUS

## 2014-02-12 MED ORDER — ACETAMINOPHEN 325 MG PO TABS: 650.0000 mg | ORAL_TABLET | Freq: Four times a day (QID) | ORAL | Status: DC | PRN

## 2014-02-12 MED ORDER — SODIUM CHLORIDE 0.9 % IV BOLUS (SEPSIS)
1000.0000 mL | Freq: Once | INTRAVENOUS | Status: AC
  Administered 2014-02-12: 500 mL via INTRAVENOUS

## 2014-02-12 NOTE — ED Notes (Signed)
Pt given urinal with instructions for cc ua

## 2014-02-12 NOTE — Progress Notes (Signed)
Admitted to 5w09, instructed on call light usage and room surroundings, denies nausea/pain at this time.

## 2014-02-12 NOTE — H&P (Addendum)
Brian Villegas is an 68 y.o. male.   PCP:   Velna Hatchet, MD   Chief Complaint:  Pancreatitis, 2-3 drinks per day  HPI: 84 M c H/o Alcohol use - (Maybe Abuse) and last seen by Dr Lemmie Evens in April.  He has Prior TGs 200-400 and recent 200+ in last 1-2 weeks.  [facility] c 2 days increasing Ab pain = Epigastric and LUQ, described as cramping, sharp, shooting and stabbing c radiation to back.  N/V worse with eating. W/Up in ED c/w Pancreatitis. [place] was (-) for Gallstone or Murphy's. Lipase was 2000.  Pt given IVF, zofran and dilaudid. LFTs mildly elevated.  I was called for inpt admission.  He started consuming lots of Advil and lots of Vodka. The good news is that his HAs are better and he is using less advil lately. However he drinks 2-4 Oz Vodka - 3 drinks per day. Recent Indigestion increase and issues. No Dark or tarry stools.    TRIGLYCERIDES        [H]  188 mg/dl  -  09/2013   Past Medical History:  Past Medical History  Diagnosis Date  . Hypertension     multiple medication intolerances  . Polycythemia     requires phlebotomies   . Alcohol consuption of more than two drinks per day   . Memory loss     "since fall w/concussion in 06/2013" (02/12/2014)  . Concussion with loss of consciousness 06/18/2013  . GERD (gastroesophageal reflux disease)   . Constipation   . Basal cell carcinoma ~ 2012    "MOHS on right neck"  . High cholesterol     "creeping up; not taking RX yet" (02/12/2014)  . Hepatitis 1952    "yellow jaundice"  . Headache(784.0)     "5d X wk since fall w/concussion in 06/2013" (02/12/2014)  . Anxiety     Past Surgical History  Procedure Laterality Date  . US echocardiography  2008    EF 50-55% , trivial AR,hypokinesis of mid inferior wall  . Nuclear stress test  08/2010    EF 49%, Normal  . Cardiac catheterization  2008  . Anterior cervical decomp/discectomy fusion  2010  . Appendectomy    . Shoulder arthroscopy Left ?2007     "labrium repair"  . Cataract extraction w/ intraocular lens  implant, bilateral Bilateral 1990's  . Orif clavicle fracture Left 2006    using DePuy clavicle pin.  . Clavicle hardware removal Left 2007  . Mohs surgery Right ~ 2012    "neck; basal cell"   esophageal ring on barium swallow - 01/31/13 - Dr Cristina Gong  falls  post concussive HAs  HTN managed by Dr Mare Ferrari  TTE 2013 - normal  TTE 12/04/2006 - normal LVEF w/ pseudonormal LV ventricular filling pattern. Trivial AV regurg.  Nuclear stress 09/01/10 - no ischemia  polycythemia - follows CBC at [facility] w/ prn phlebotomy  peyronie's disease  Vit D def  hypogonadism  GERD - followed by [facility]  cervical disc disease  cerebral aneurysm -- stable on MRA Aug 2014  (Cardiology * Dr Mare Ferrari w/ [facility]) [facility] in [place] * Dr Verdell Carmine) (GI * Dr Cristina Gong w/ [facility]) ( Dr Martyn Malay * [facility] ) ( Dr Ronnald Ramp * NSG ) ( Dr patel * neuro )  Surgical History (reviewed - no changes required): lap appy  cervical fusion x 2 - 1991, 2007 (Dr Kristeen Miss and Dr Shanon Brow  Ronnald Ramp)  Family History (reviewed - no changes required): no MI,CVA,cancer hx  Social History (reviewed - no changes required): married. masters. retired from [employer] companies for over 40 years  tob: occ in high school and college, but none since college  etoh: occ  drugs: none       Allergies:   Allergies  Allergen Reactions  . Amlodipine   . Bystolic [Nebivolol Hcl]   . Codeine Itching and Nausea And Vomiting  . Lisinopril     cough  . Losartan Other (See Comments)    Fatigue, felt bad Nausea and dizziness  . Micardis Hct [Telmisartan-Hctz]     Cough, anxious  . Olmesartan Other (See Comments)  . Toprol Xl [Metoprolol Succinate]   . Triamterene      Medications: Prior to Admission medications   Medication Sig Start Date End Date Taking? Authorizing Provider  ALPRAZolam Duanne Moron) 0.5 MG tablet Take 1 tablet by mouth  as needed for anxiety.  01/29/11   Historical Provider, MD  cyanocobalamin (,VITAMIN B-12,) 1000 MCG/ML injection Inject 100 mcg into the muscle every 30 (thirty) days.  04/16/12   Historical Provider, MD  diltiazem (CARDIZEM CD) 120 MG 24 hr capsule Take 1 capsule (120 mg total) by mouth as directed. 1 daily in the evening 09/05/13   Darlin Coco, MD  losartan (COZAAR) 25 MG tablet Take 1 tablet (25 mg total) by mouth daily. 09/05/13   Darlin Coco, MD  pantoprazole (PROTONIX) 20 MG tablet Take 20 mg by mouth daily.    Historical Provider, MD  zolpidem (AMBIEN) 10 MG tablet Take 10 mg by mouth at bedtime as needed for sleep.     Historical Provider, MD     Medications Prior to Admission  Medication Sig Dispense Refill  . ALPRAZolam (XANAX) 0.5 MG tablet Take 1 tablet by mouth as needed for anxiety.       . cyanocobalamin (,VITAMIN B-12,) 1000 MCG/ML injection Inject 100 mcg into the muscle every 30 (thirty) days.       [name] diltiazem (CARDIZEM CD) 120 MG 24 hr capsule Take 1 capsule (120 mg total) by mouth as directed. 1 daily in the evening  30 capsule  6  . losartan (COZAAR) 25 MG tablet Take 1 tablet (25 mg total) by mouth daily.  60 tablet  6  . pantoprazole (PROTONIX) 20 MG tablet Take 20 mg by mouth daily.      [name] zolpidem (AMBIEN) 10 MG tablet Take 10 mg by mouth at bedtime as needed for sleep.          Social History:  reports that he has never smoked. He has never used smokeless tobacco. He reports that he drinks about 4.2 ounces of alcohol per week. He reports that he does not use illicit drugs.  Family History: Family History  Problem Relation Age of Onset  . Other Mother     Died, 90 complicated from esophageal stricutre  . Other Father     Died, 22 from carbon monoxide poisoning  . Healthy Brother   . Healthy Daughter     Review of Systems:  Review of Systems - See HPI. No CP/SOB. No Blood in stools. + Reflux. HAs are doing some better.  Physical Exam:  Blood  pressure 155/89, pulse 83, temperature 97.5 F (36.4 C), temperature source Oral, resp. rate 16, height 6\' 2"  (1.88 m), weight 97.342 kg (214 lb 9.6 oz), SpO2 94.00%. Filed Vitals:   02/12/14 6629 02/12/14 1301 02/12/14 1451  02/12/14 1541  BP: 152/102 142/96 144/84 155/89  Pulse: 104 92  83  Temp: 98.2 F (36.8 C) 98.3 F (36.8 C)  97.5 F (36.4 C)  TempSrc: Oral Oral  Oral  Resp: 18 18 18 16   Height: 6\' 2"  (1.88 m)   6\' 2"  (1.88 m)  Weight: 95.255 kg (210 lb)   97.342 kg (214 lb 9.6 oz)  SpO2: 97% 96% 95% 94%   General appearance: A and O.  Looks better than expected. Head: Normocephalic, without obvious abnormality, atraumatic Eyes: conjunctivae/corneas clear. PERRL, EOM's intact.  Nose: Nares normal. Septum midline. Mucosa normal. No drainage or sinus tenderness. OP - Dry Resp: CTA Cardio: reg GI: Bloated. Softer than expected, min-tender; bowel sounds distant; no masses,  no organomegaly Extremities: extremities normal, atraumatic, no cyanosis or edema Pulses: 2+ and symmetric Lymph nodes:  no cervical lymphadenopathy Neurologic: Alert and oriented X 3, normal strength and tone. Normal symmetric reflexes.  No stigmata of Alcohol Dz.     Labs on Admission:   Recent Labs  02/12/14 0945  NA 137  K 3.6*  CL 96  CO2 23  GLUCOSE 148*  BUN 22  CREATININE 1.20  CALCIUM 9.5    Recent Labs  02/12/14 0945  AST 111*  ALT 80*  ALKPHOS 102  BILITOT 1.9*  PROT 7.1  ALBUMIN 3.9    Recent Labs  02/12/14 0945  LIPASE 2054*    Recent Labs  02/12/14 0945  WBC 11.7*  NEUTROABS 9.5*  HGB 17.6*  HCT 47.9  MCV 102.1*  PLT 190   No results found for this basename: CKTOTAL, CKMB, CKMBINDEX, TROPONINI,  in the last 72 hours Lab Results  Component Value Date   INR 1.1 05/18/2009   INR 0.9 RATIO 03/14/2007     LAB RESULT POCT:  Results for orders placed during the hospital encounter of 02/12/14  CBC WITH DIFFERENTIAL      Result Value Ref Range   WBC 11.7  (*) 4.0 - 10.5 K/uL   RBC 4.69  4.22 - 5.81 MIL/uL   Hemoglobin 17.6 (*) 13.0 - 17.0 g/dL   HCT 47.9  39.0 - 52.0 %   MCV 102.1 (*) 78.0 - 100.0 fL   MCH 37.5 (*) 26.0 - 34.0 pg   MCHC 36.7 (*) 30.0 - 36.0 g/dL   RDW 13.2  11.5 - 15.5 %   Platelets 190  150 - 400 K/uL   Neutrophils Relative % 81 (*) 43 - 77 %   Neutro Abs 9.5 (*) 1.7 - 7.7 K/uL   Lymphocytes Relative 8 (*) 12 - 46 %   Lymphs Abs 1.0  0.7 - 4.0 K/uL   Monocytes Relative 9  3 - 12 %   Monocytes Absolute 1.0  0.1 - 1.0 K/uL   Eosinophils Relative 2  0 - 5 %   Eosinophils Absolute 0.2  0.0 - 0.7 K/uL   Basophils Relative 0  0 - 1 %   Basophils Absolute 0.0  0.0 - 0.1 K/uL  COMPREHENSIVE METABOLIC PANEL      Result Value Ref Range   Sodium 137  137 - 147 mEq/L   Potassium 3.6 (*) 3.7 - 5.3 mEq/L   Chloride 96  96 - 112 mEq/L   CO2 23  19 - 32 mEq/L   Glucose, Bld 148 (*) 70 - 99 mg/dL   BUN 22  6 - 23 mg/dL   Creatinine, Ser 1.20  0.50 - 1.35 mg/dL   Calcium 9.5  8.4 - 10.5 mg/dL   Total Protein 7.1  6.0 - 8.3 g/dL   Albumin 3.9  3.5 - 5.2 g/dL   AST 111 (*) 0 - 37 U/L   ALT 80 (*) 0 - 53 U/L   Alkaline Phosphatase 102  39 - 117 U/L   Total Bilirubin 1.9 (*) 0.3 - 1.2 mg/dL   GFR calc non Af Amer 60 (*) >90 mL/min   GFR calc Af Amer 70 (*) >90 mL/min  LIPASE, BLOOD      Result Value Ref Range   Lipase 2054 (*) 11 - 59 U/L  I-STAT CG4 LACTIC ACID, ED      Result Value Ref Range   Lactic Acid, Venous 2.54 (*) 0.5 - 2.2 mmol/L      Radiological Exams on Admission: US Abdomen Complete  02/12/2014   CLINICAL DATA:  One-day history of upper back and abdominal pain with nausea and vomiting  EXAM: ULTRASOUND ABDOMEN COMPLETE  COMPARISON:  Abdominal ultrasound of September 30, 2010  FINDINGS: Gallbladder:  No gallstones or wall thickening visualized. No sonographic Murphy sign noted.  Common bile duct:  Diameter: 3 mm  Liver:  No focal lesion identified. The hepatic echotexture is increased consistent with fatty  infiltrative change. No intrahepatic ductal dilation is demonstrated.  IVC:  Bowel gas limits evaluation of the IVC.  Pancreas:  The pancreas is largely obscured by bowel gas.  Spleen:  Size and appearance within normal limits.  Right Kidney:  Length: 12 cm. Echogenicity within normal limits. In the lateral midpole cortex a stable hypoechoic focus with enhanced through transmission most compatible with a 2.1 cm diameter cyst. There is no hydronephrosis.  Left Kidney:  Length: 11.9 cm. Echogenicity within normal limits. No mass or hydronephrosis visualized.  Abdominal aorta:  Bowel gas limits evaluation of the proximal abdominal aorta. The mid and distal aorta are normal in appearance.  Other findings:  None.  IMPRESSION: There is no acute hepatobiliary abnormality. There are fatty infiltrative changes of the liver and evaluation of the pancreas is limited by bowel gas. There is a stable right renal cyst.   Electronically Signed   By: David  Martinique   On: 02/12/2014 12:28      Orders placed during the hospital encounter of 01/31/13  . EKG 12-LEAD  . EKG 12-LEAD  . ED EKG  . ED EKG  . EKG     Assessment/Plan Principal Problem:   Pancreatitis  Pancreatitis - NPO. Pain Control. Nausea control. Consider GI and/or CT scanning. IV PPI Consider quitting ETOH forever - He and wife have discussed possible wellness kick  Needs to quit Alcohol. Ativan on ordered in case he starts to show S/s withdrawal. He is talking @ getting in c [facility] as outpt CIWA protocol q 6 hrs.  GERD/esophageal ring on barium swallow - 01/31/13 - Dr Cristina Gong  PPI. May need GI/EGD if any Sxs show.  H/O post concussive HAs - improving  Known HTN   Nuclear stress 09/01/10 - no ischemia   polycythemia - follows CBC at [facility] w/ prn phlebotomy  Right now Hemoconcentrated. Recheck in am.  DVT Proph ordered.  I cancelled his lab appt at my office tomorrow for his APE labs   Teng Decou M 02/12/2014, 6:42  PM

## 2014-02-12 NOTE — ED Notes (Signed)
Unable to void; urinal at bedside.

## 2014-02-12 NOTE — Progress Notes (Signed)
  Notified Dr. Virgina Jock of pt arrival( spoke to [facility]).

## 2014-02-12 NOTE — ED Notes (Signed)
Patient asked to change into gown.  RN at bedside for triage.

## 2014-02-12 NOTE — ED Provider Notes (Addendum)
CSN: [id number]     Arrival date & time 02/12/14  0913 History   First MD Initiated Contact with Patient 02/12/14 [phone]     Chief Complaint  Patient presents with  . Abdominal Pain     (Consider location/radiation/quality/duration/timing/severity/associated sxs/prior Treatment) Patient is a 68 y.o. male presenting with abdominal pain. The history is provided by the patient.  Abdominal Pain Pain location:  Epigastric and LUQ Pain quality: cramping, sharp, shooting and stabbing   Pain radiates to:  Back Pain severity:  Severe Onset quality:  Gradual Duration:  2 days Timing:  Constant Progression:  Worsening Chronicity:  New Context: alcohol use, awakening from sleep and eating   Context: not medication withdrawal, not sick contacts, not suspicious food intake and not trauma   Relieved by:  Nothing Worsened by:  Eating Ineffective treatments:  NSAIDs Associated symptoms: anorexia, nausea and vomiting   Associated symptoms: no chest pain, no chills, no constipation, no cough, no diarrhea, no dysuria, no fever, no hematemesis and no shortness of breath   Risk factors: has not had multiple surgeries and no NSAID use   Risk factors comment:  Drinks about 2 or 3 drinks daily   Past Medical History  Diagnosis Date  . Hypertension     multiple medication intolerances  . Polycythemia     requires phlebotomies   . Alcohol consuption of more than two drinks per day   . Memory loss   . Concussion with loss of consciousness 06/18/2013  . GERD (gastroesophageal reflux disease)   . Constipation    Past Surgical History  Procedure Laterality Date  . US echocardiography  2008    EF 50-55% , trivial AR,hypokinesis of mid inferior wall  . Nuclear stress test  08/2010    EF 49%, Normal  . Cardiac catheterization  2008  . Cervical discectomy  2010  . Appendectomy    . Left shoulder surgery     Family History  Problem Relation Age of Onset  . Other Mother     Died, 90 complicated  from esophageal stricutre  . Other Father     Died, 67 from carbon monoxide poisoning  . Healthy Brother   . Healthy Daughter    History  Substance Use Topics  . Smoking status: Never Smoker   . Smokeless tobacco: Not on file  . Alcohol Use: Yes     Comment: 1 shot of vodka nightly x 20years    Review of Systems  Constitutional: Negative for fever and chills.  Respiratory: Negative for cough and shortness of breath.   Cardiovascular: Negative for chest pain.  Gastrointestinal: Positive for nausea, vomiting, abdominal pain and anorexia. Negative for diarrhea, constipation and hematemesis.  Genitourinary: Negative for dysuria.  All other systems reviewed and are negative.     Allergies  Amlodipine; Bystolic; Codeine; Lisinopril; Losartan; Micardis hct; Olmesartan; Toprol xl; and Triamterene  Home Medications   Prior to Admission medications   Medication Sig Start Date End Date Taking? Authorizing Provider  ALPRAZolam Duanne Moron) 0.5 MG tablet Take 1 tablet by mouth as needed for anxiety.  01/29/11   Historical Provider, MD  cyanocobalamin (,VITAMIN B-12,) 1000 MCG/ML injection Inject 100 mcg into the muscle every 30 (thirty) days.  04/16/12   Historical Provider, MD  diltiazem (CARDIZEM CD) 120 MG 24 hr capsule Take 1 capsule (120 mg total) by mouth as directed. 1 daily in the evening 09/05/13   Darlin Coco, MD  losartan (COZAAR) 25 MG tablet Take 1 tablet (25  mg total) by mouth daily. 09/05/13   Darlin Coco, MD  pantoprazole (PROTONIX) 20 MG tablet Take 20 mg by mouth daily.    Historical Provider, MD  zolpidem (AMBIEN) 10 MG tablet Take 10 mg by mouth at bedtime as needed for sleep.     Historical Provider, MD   BP 152/102  Pulse 104  Temp(Src) 98.2 F (36.8 C) (Oral)  Resp 18  Ht 6\' 2"  (1.88 m)  Wt 210 lb (95.255 kg)  BMI 26.95 kg/m2  SpO2 97% Physical Exam  Constitutional: He is oriented to person, place, and time. He appears well-developed and well-nourished. No  distress.  HENT:  Head: Normocephalic and atraumatic.  Right Ear: External ear normal.  Left Ear: External ear normal.  Mouth/Throat: Oropharynx is clear and moist. Mucous membranes are dry.  Eyes: Conjunctivae and EOM are normal. Pupils are equal, round, and reactive to light. Right eye exhibits no discharge.  Neck: Normal range of motion. Neck supple.  Cardiovascular: Regular rhythm, normal heart sounds and intact distal pulses.  Tachycardia present.   No murmur heard. Pulmonary/Chest: Tachypnea noted. No respiratory distress. He has no decreased breath sounds. He has no wheezes. He has no rhonchi. He has no rales.  Abdominal: Soft. Normal appearance and bowel sounds are normal. There is tenderness in the epigastric area and left upper quadrant. There is no CVA tenderness.  Musculoskeletal: Normal range of motion. He exhibits no edema and no tenderness.  Neurological: He is alert and oriented to person, place, and time.  Skin: Skin is warm and dry. No rash noted.  Psychiatric: He has a normal mood and affect.    ED Course  Procedures (including critical care time) Labs Review Labs Reviewed  CBC WITH DIFFERENTIAL - Abnormal; Notable for the following:    WBC 11.7 (*)    Hemoglobin 17.6 (*)    MCV 102.1 (*)    MCH 37.5 (*)    MCHC 36.7 (*)    Neutrophils Relative % 81 (*)    Neutro Abs 9.5 (*)    Lymphocytes Relative 8 (*)    All other components within normal limits  COMPREHENSIVE METABOLIC PANEL - Abnormal; Notable for the following:    Potassium 3.6 (*)    Glucose, Bld 148 (*)    AST 111 (*)    ALT 80 (*)    Total Bilirubin 1.9 (*)    GFR calc non Af Amer 60 (*)    GFR calc Af Amer 70 (*)    All other components within normal limits  LIPASE, BLOOD - Abnormal; Notable for the following:    Lipase 2054 (*)    All other components within normal limits  I-STAT CG4 LACTIC ACID, ED - Abnormal; Notable for the following:    Lactic Acid, Venous 2.54 (*)    All other  components within normal limits  URINALYSIS, ROUTINE W REFLEX MICROSCOPIC    Imaging Review US Abdomen Complete  02/12/2014   CLINICAL DATA:  One-day history of upper back and abdominal pain with nausea and vomiting  EXAM: ULTRASOUND ABDOMEN COMPLETE  COMPARISON:  Abdominal ultrasound of September 30, 2010  FINDINGS: Gallbladder:  No gallstones or wall thickening visualized. No sonographic Murphy sign noted.  Common bile duct:  Diameter: 3 mm  Liver:  No focal lesion identified. The hepatic echotexture is increased consistent with fatty infiltrative change. No intrahepatic ductal dilation is demonstrated.  IVC:  Bowel gas limits evaluation of the IVC.  Pancreas:  The pancreas is largely  obscured by bowel gas.  Spleen:  Size and appearance within normal limits.  Right Kidney:  Length: 12 cm. Echogenicity within normal limits. In the lateral midpole cortex a stable hypoechoic focus with enhanced through transmission most compatible with a 2.1 cm diameter cyst. There is no hydronephrosis.  Left Kidney:  Length: 11.9 cm. Echogenicity within normal limits. No mass or hydronephrosis visualized.  Abdominal aorta:  Bowel gas limits evaluation of the proximal abdominal aorta. The mid and distal aorta are normal in appearance.  Other findings:  None.  IMPRESSION: There is no acute hepatobiliary abnormality. There are fatty infiltrative changes of the liver and evaluation of the pancreas is limited by bowel gas. There is a stable right renal cyst.   Electronically Signed   By: David  Martinique   On: 02/12/2014 12:28     EKG Interpretation None      MDM   Final diagnoses:  Pancreatitis    Pt with hx of 2 days abd pain that radiates into his back and cause N/V worse with eating.  HD stable on exam but significant LUQ and epigastric pain.  Denies recent med changes and no hx of gallstones with norma U/S in 2012 but does drink 2-3 etoh beverages daily.  No hx of pancreatitis in the past.  Hx concerning for  pancreatitis vs gallbladder disease vs hepatitis vs gastritis.  Low suspicion for diverticulitis and obstruction. CBC, CMP, lipase, lactate pending.  Pt given IVF, zofran and dilaudid.  10:34 AM Evidence of dehydration and pancreatitis with elevated LFT's.  U/S ordered to evaluate the gallbladder.  12:32 PM U/S neg for gallbladder pathology.  Will admit for pancreatitis.  Blanchie Dessert, MD 02/12/14 1233  Blanchie Dessert, MD 02/12/14 1233

## 2014-02-12 NOTE — ED Notes (Signed)
Pt amb to room 7 with quick steady gait in nad. Pt reports nausea x Monday, yesterday developed generalized abd pain, called his gi doctor yesterday and was told to go to the ER. Pt states he presented to [facility] yesterday, "but the wait was a few hours, and I couldn't wait that long.Marland KitchenMarland Kitchen"

## 2014-02-13 LAB — CBC
HCT: 43.6 % (ref 39.0–52.0)
Hemoglobin: 14.9 g/dL (ref 13.0–17.0)
MCH: 35.7 pg — AB (ref 26.0–34.0)
MCHC: 34.2 g/dL (ref 30.0–36.0)
MCV: 104.6 fL — ABNORMAL HIGH (ref 78.0–100.0)
Platelets: 152 10*3/uL (ref 150–400)
RBC: 4.17 MIL/uL — ABNORMAL LOW (ref 4.22–5.81)
RDW: 14.1 % (ref 11.5–15.5)
WBC: 12.5 10*3/uL — ABNORMAL HIGH (ref 4.0–10.5)

## 2014-02-13 LAB — COMPREHENSIVE METABOLIC PANEL
ALT: 51 U/L (ref 0–53)
AST: 71 U/L — AB (ref 0–37)
Albumin: 3.3 g/dL — ABNORMAL LOW (ref 3.5–5.2)
Alkaline Phosphatase: 79 U/L (ref 39–117)
BUN: 13 mg/dL (ref 6–23)
CO2: 22 meq/L (ref 19–32)
Calcium: 8.4 mg/dL (ref 8.4–10.5)
Chloride: 99 mEq/L (ref 96–112)
Creatinine, Ser: 0.9 mg/dL (ref 0.50–1.35)
GFR calc non Af Amer: 85 mL/min — ABNORMAL LOW (ref >90)
GLUCOSE: 90 mg/dL (ref 70–99)
Potassium: 4.2 mEq/L (ref 3.7–5.3)
SODIUM: 137 meq/L (ref 137–147)
Total Bilirubin: 1.6 mg/dL — ABNORMAL HIGH (ref 0.3–1.2)
Total Protein: 6.5 g/dL (ref 6.0–8.3)

## 2014-02-13 LAB — AMYLASE: Amylase: 204 U/L — ABNORMAL HIGH (ref 0–105)

## 2014-02-13 LAB — LIPASE, BLOOD: Lipase: 743 U/L — ABNORMAL HIGH (ref 11–59)

## 2014-02-13 NOTE — Progress Notes (Signed)
Utilization review completed.  

## 2014-02-13 NOTE — Progress Notes (Signed)
Physician Daily Progress Note  Subjective: Having pain in the abd radiating to back Still having HA although better Having nausea , no emesis since admission   Objective: Vital signs in last 24 hours: Temp:  [97.5 F (36.4 C)-98.6 F (37 C)] 98.6 F (37 C) (06/12 0518) Pulse Rate:  [83-104] 99 (06/12 0518) Resp:  [16-18] 18 (06/12 0518) BP: (142-155)/(81-102) 151/81 mmHg (06/12 0518) SpO2:  [94 %-97 %] 94 % (06/12 0518) Weight:  [210 lb (95.255 kg)-214 lb 14.4 oz (97.478 kg)] 214 lb 14.4 oz (97.478 kg) (06/12 0518) Weight change:  Last BM Date: 02/11/14  CBG (last 3)  No results found for this basename: GLUCAP,  in the last 72 hours  Intake/Output from previous day:  Intake/Output Summary (Last 24 hours) at 02/13/14 0809 Last data filed at 02/13/14 0519  Gross per 24 hour  Intake      0 ml  Output    250 ml  Net   -250 ml   06/11 0701 - 06/12 0700 In: -  Out: 250 [Urine:250]  Physical Exam General appearance: WM in mild distress  Eyes: no scleral icterus Throat: oropharynx moist without erythema Resp: CTAB, no wheezing/rales  Cardio: RRR, no MRG  GI: soft, non-tender; bowel sounds normal; no masses,  no organomegaly Extremities: no clubbing, cyanosis or edema   Lab Results:  Recent Labs  02/12/14 0945 02/13/14 0520  NA 137 137  K 3.6* 4.2  CL 96 99  CO2 23 22  GLUCOSE 148* 90  BUN 22 13  CREATININE 1.20 0.90  CALCIUM 9.5 8.4     Recent Labs  02/12/14 0945 02/13/14 0520  AST 111* 71*  ALT 80* 51  ALKPHOS 102 79  BILITOT 1.9* 1.6*  PROT 7.1 6.5  ALBUMIN 3.9 3.3*     Recent Labs  02/12/14 0945 02/13/14 0520  WBC 11.7* 12.5*  NEUTROABS 9.5*  --   HGB 17.6* 14.9  HCT 47.9 43.6  MCV 102.1* 104.6*  PLT 190 152    Lab Results  Component Value Date   INR 1.1 05/18/2009   INR 0.9 RATIO 03/14/2007    No results found for this basename: CKTOTAL, CKMB, CKMBINDEX, TROPONINI,  in the last 72 hours  No results found for this basename:  TSH, T4TOTAL, FREET3, T3FREE, THYROIDAB,  in the last 72 hours  No results found for this basename: VITAMINB12, FOLATE, FERRITIN, TIBC, IRON, RETICCTPCT,  in the last 72 hours  Micro Results: No results found for this or any previous visit (from the past 240 hour(s)).  Studies/Results: US Abdomen Complete  02/12/2014   CLINICAL DATA:  One-day history of upper back and abdominal pain with nausea and vomiting  EXAM: ULTRASOUND ABDOMEN COMPLETE  COMPARISON:  Abdominal ultrasound of September 30, 2010  FINDINGS: Gallbladder:  No gallstones or wall thickening visualized. No sonographic Murphy sign noted.  Common bile duct:  Diameter: 3 mm  Liver:  No focal lesion identified. The hepatic echotexture is increased consistent with fatty infiltrative change. No intrahepatic ductal dilation is demonstrated.  IVC:  Bowel gas limits evaluation of the IVC.  Pancreas:  The pancreas is largely obscured by bowel gas.  Spleen:  Size and appearance within normal limits.  Right Kidney:  Length: 12 cm. Echogenicity within normal limits. In the lateral midpole cortex a stable hypoechoic focus with enhanced through transmission most compatible with a 2.1 cm diameter cyst. There is no hydronephrosis.  Left Kidney:  Length: 11.9 cm. Echogenicity within normal limits. No  mass or hydronephrosis visualized.  Abdominal aorta:  Bowel gas limits evaluation of the proximal abdominal aorta. The mid and distal aorta are normal in appearance.  Other findings:  None.  IMPRESSION: There is no acute hepatobiliary abnormality. There are fatty infiltrative changes of the liver and evaluation of the pancreas is limited by bowel gas. There is a stable right renal cyst.   Electronically Signed   By: David  Martinique   On: 02/12/2014 12:28     Medications: Scheduled: . heparin  5,000 Units Subcutaneous 3 times per day  . pantoprazole (PROTONIX) IV  40 mg Intravenous Q24H   Continuous: . sodium chloride 125 mL/hr at 02/13/14 0119     Assessment/Plan:  Pancreatitis - etoh induced. NPO, advance as tolerated. Pain Control. Nausea control. Fatty liver on U/S and slight improvement on LFTs today. No stones on U/S. Will hold on CT at this time unless clinically deteriorates. Again counseled on etoh cessation   GERD/chronic NSAID/ETOH use - IV PPI while intol to PO. Transition to PO PPI when improves. Counseled on cessation of NSAIDs and EtOH   Polycythemia - being managed as outpt . Follow Hgb    DVT Prophylaxis - SCDs    LOS: 1 day   Gryphon Vanderveen 02/13/2014, 8:09 AM

## 2014-02-14 LAB — COMPREHENSIVE METABOLIC PANEL
ALT: 42 U/L (ref 0–53)
AST: 62 U/L — AB (ref 0–37)
Albumin: 2.7 g/dL — ABNORMAL LOW (ref 3.5–5.2)
Alkaline Phosphatase: 82 U/L (ref 39–117)
BUN: 7 mg/dL (ref 6–23)
CHLORIDE: 100 meq/L (ref 96–112)
CO2: 23 mEq/L (ref 19–32)
Calcium: 8.1 mg/dL — ABNORMAL LOW (ref 8.4–10.5)
Creatinine, Ser: 0.85 mg/dL (ref 0.50–1.35)
GFR calc Af Amer: >90 mL/min (ref >90)
GFR calc non Af Amer: 88 mL/min — ABNORMAL LOW (ref >90)
Glucose, Bld: 80 mg/dL (ref 70–99)
Potassium: 3.4 mEq/L — ABNORMAL LOW (ref 3.7–5.3)
Sodium: 138 mEq/L (ref 137–147)
TOTAL PROTEIN: 5.9 g/dL — AB (ref 6.0–8.3)
Total Bilirubin: 1.7 mg/dL — ABNORMAL HIGH (ref 0.3–1.2)

## 2014-02-14 LAB — CBC
HCT: 39.2 % (ref 39.0–52.0)
HEMOGLOBIN: 13.1 g/dL (ref 13.0–17.0)
MCH: 35.2 pg — AB (ref 26.0–34.0)
MCHC: 33.4 g/dL (ref 30.0–36.0)
MCV: 105.4 fL — ABNORMAL HIGH (ref 78.0–100.0)
Platelets: 131 10*3/uL — ABNORMAL LOW (ref 150–400)
RBC: 3.72 MIL/uL — ABNORMAL LOW (ref 4.22–5.81)
RDW: 13.8 % (ref 11.5–15.5)
WBC: 9.3 10*3/uL (ref 4.0–10.5)

## 2014-02-14 LAB — LIPASE, BLOOD: LIPASE: 68 U/L — AB (ref 11–59)

## 2014-02-14 LAB — AMYLASE: AMYLASE: 49 U/L (ref 0–105)

## 2014-02-14 MED ORDER — BISACODYL 5 MG PO TBEC
5.0000 mg | DELAYED_RELEASE_TABLET | Freq: Every day | ORAL | Status: DC | PRN
  Administered 2014-02-14: 5 mg via ORAL
  Filled 2014-02-14 (×2): qty 1

## 2014-02-14 NOTE — Progress Notes (Signed)
Subjective: Had a good night. Some epigastric tenderness and mild nausea. Tolerating clear liquids. No tremulousness. No chills or sweats. No BM yet   Objective: Vital signs in last 24 hours: Temp:  [98.6 F (37 C)-99.1 F (37.3 C)] 99.1 F (37.3 C) (06/13 0610) Pulse Rate:  [76-83] 77 (06/13 0610) Resp:  [15-20] 15 (06/13 0610) BP: (137-167)/(80-94) 137/80 mmHg (06/13 0610) SpO2:  [95 %-96 %] 95 % (06/13 0610) Weight:  [98.4 kg (216 lb 14.9 oz)] 98.4 kg (216 lb 14.9 oz) (06/13 0610)  Intake/Output from previous day: 06/12 0701 - 06/13 0700 In: 125 [I.V.:125] Out: 625 [Urine:625] Intake/Output this shift: Total I/O In: 360 [P.O.:360] Out: -   Lying on left side in no distress. Anicteric. Oral membranes moist. Lungs clear ht regular abd obese, sl distended. Reduced BS's, pulses intact, awake, mentating well, no tremors  Lab Results   Recent Labs  02/13/14 0520 02/14/14 0456  WBC 12.5* 9.3  RBC 4.17* 3.72*  HGB 14.9 13.1  HCT 43.6 39.2  MCV 104.6* 105.4*  MCH 35.7* 35.2*  RDW 14.1 13.8  PLT 152 131*    Recent Labs  02/13/14 0520 02/14/14 0456  NA 137 138  K 4.2 3.4*  CL 99 100  CO2 22 23  GLUCOSE 90 80  BUN 13 7  CREATININE 0.90 0.85  CALCIUM 8.4 8.1*  Results for Brian Villegas, Brian Villegas (MRN [id number]) as of 02/14/2014 10:13  Ref. Range 02/13/2014 05:20 02/14/2014 04:56  Alkaline Phosphatase Latest Range: 39-117 U/L 79 82  Albumin Latest Range: 3.5-5.2 g/dL 3.3 (L) 2.7 (L)  Amylase Latest Range: 0-105 U/L 204 (H) 49  Lipase Latest Range: 11-59 U/L 743 (H) 68 (H)  AST Latest Range: 0-37 U/L 71 (H) 62 (H)  ALT Latest Range: 0-53 U/L 51 42  Total Protein Latest Range: 6.0-8.3 g/dL 6.5 5.9 (L)  Total Bilirubin Latest Range: 0.3-1.2 mg/dL 1.6 (H) 1.7 (H)    Studies/Results: US Abdomen Complete  02/12/2014   CLINICAL DATA:  One-day history of upper back and abdominal pain with nausea and vomiting  EXAM: ULTRASOUND ABDOMEN COMPLETE  COMPARISON:  Abdominal ultrasound of  September 30, 2010  FINDINGS: Gallbladder:  No gallstones or wall thickening visualized. No sonographic Murphy sign noted.  Common bile duct:  Diameter: 3 mm  Liver:  No focal lesion identified. The hepatic echotexture is increased consistent with fatty infiltrative change. No intrahepatic ductal dilation is demonstrated.  IVC:  Bowel gas limits evaluation of the IVC.  Pancreas:  The pancreas is largely obscured by bowel gas.  Spleen:  Size and appearance within normal limits.  Right Kidney:  Length: 12 cm. Echogenicity within normal limits. In the lateral midpole cortex a stable hypoechoic focus with enhanced through transmission most compatible with a 2.1 cm diameter cyst. There is no hydronephrosis.  Left Kidney:  Length: 11.9 cm. Echogenicity within normal limits. No mass or hydronephrosis visualized.  Abdominal aorta:  Bowel gas limits evaluation of the proximal abdominal aorta. The mid and distal aorta are normal in appearance.  Other findings:  None.  IMPRESSION: There is no acute hepatobiliary abnormality. There are fatty infiltrative changes of the liver and evaluation of the pancreas is limited by bowel gas. There is a stable right renal cyst.   Electronically Signed   By: David  Martinique   On: 02/12/2014 12:28    Scheduled Meds: . heparin  5,000 Units Subcutaneous 3 times per day  . pantoprazole (PROTONIX) IV  40 mg Intravenous Q24H  Continuous Infusions: . sodium chloride 125 mL/hr at 02/14/14 0110   PRN Meds:acetaminophen, acetaminophen, fentaNYL, LORazepam  Assessment/Plan: PANCREATITIS: Improving LFT's and pancreatic enzymes. WBC, glucose and Ca OK. Tolerating diet advancement WITHDRAWAL: Doing well  POLYCYTHEMIA: improved.   LOS: 2 days   Brian Villegas Brian Villegas 02/14/2014, 10:14 AM

## 2014-02-15 LAB — COMPREHENSIVE METABOLIC PANEL
ALBUMIN: 2.6 g/dL — AB (ref 3.5–5.2)
ALK PHOS: 83 U/L (ref 39–117)
ALT: 35 U/L (ref 0–53)
AST: 60 U/L — ABNORMAL HIGH (ref 0–37)
BILIRUBIN TOTAL: 1.5 mg/dL — AB (ref 0.3–1.2)
BUN: 7 mg/dL (ref 6–23)
CHLORIDE: 98 meq/L (ref 96–112)
CO2: 19 mEq/L (ref 19–32)
Calcium: 8.3 mg/dL — ABNORMAL LOW (ref 8.4–10.5)
Creatinine, Ser: 0.74 mg/dL (ref 0.50–1.35)
GFR calc non Af Amer: >90 mL/min (ref >90)
GLUCOSE: 78 mg/dL (ref 70–99)
POTASSIUM: 3.3 meq/L — AB (ref 3.7–5.3)
Sodium: 137 mEq/L (ref 137–147)
Total Protein: 6 g/dL (ref 6.0–8.3)

## 2014-02-15 LAB — AMYLASE: Amylase: 35 U/L (ref 0–105)

## 2014-02-15 LAB — CBC
HCT: 37.4 % — ABNORMAL LOW (ref 39.0–52.0)
Hemoglobin: 12.9 g/dL — ABNORMAL LOW (ref 13.0–17.0)
MCH: 35.8 pg — ABNORMAL HIGH (ref 26.0–34.0)
MCHC: 34.5 g/dL (ref 30.0–36.0)
MCV: 103.9 fL — AB (ref 78.0–100.0)
Platelets: 149 10*3/uL — ABNORMAL LOW (ref 150–400)
RBC: 3.6 MIL/uL — ABNORMAL LOW (ref 4.22–5.81)
RDW: 13.4 % (ref 11.5–15.5)
WBC: 9.2 10*3/uL (ref 4.0–10.5)

## 2014-02-15 LAB — LIPASE, BLOOD: Lipase: 61 U/L — ABNORMAL HIGH (ref 11–59)

## 2014-02-15 MED ORDER — POTASSIUM CHLORIDE CRYS ER 20 MEQ PO TBCR: 20.0000 meq | EXTENDED_RELEASE_TABLET | Freq: Every day | ORAL | Status: DC

## 2014-02-15 MED ORDER — PANTOPRAZOLE SODIUM 40 MG PO TBEC
40.0000 mg | DELAYED_RELEASE_TABLET | Freq: Every day | ORAL | Status: DC
  Administered 2014-02-15: 40 mg via ORAL
  Filled 2014-02-15: qty 1

## 2014-02-15 MED ORDER — POTASSIUM CHLORIDE CRYS ER 20 MEQ PO TBCR
20.0000 meq | EXTENDED_RELEASE_TABLET | Freq: Every day | ORAL | Status: DC
  Administered 2014-02-15: 20 meq via ORAL
  Filled 2014-02-15: qty 1

## 2014-02-15 NOTE — Progress Notes (Signed)
CARE MANAGEMENT NOTE 02/15/2014  Patient:  Brian Villegas, Brian Villegas   Account Number:  [id number]  Date Initiated:  02/15/2014  Documentation initiated by:  Tomi Bamberger  Subjective/Objective Assessment:   dx pancreatitis  admit- from home     Action/Plan:   Anticipated DC Date:  02/15/2014   Anticipated DC Plan:  [place]  CM consult      Choice offered to / List presented to:             Status of service:  Completed, signed off Medicare Important Message given?  NA - LOS <3 / Initial given by admissions (If response is "NO", the following Medicare IM given date fields will be blank) Date Medicare IM given:   Date Additional Medicare IM given:    Discharge Disposition:  HOME/SELF CARE  Per UR Regulation:  Reviewed for med. necessity/level of care/duration of stay  If discussed at [place] of Stay Meetings, dates discussed:    Comments:

## 2014-02-15 NOTE — Discharge Instructions (Signed)
Avoid all alcohol, avoid all motrin like medications, avoid tylenol. Avoid dietary and food supplements Stay well hydrated

## 2014-02-15 NOTE — Discharge Summary (Signed)
DISCHARGE SUMMARY  Brian Villegas  MR#: [id number]  DOB:1945/11/05  Date of Admission: 02/12/2014 Date of Discharge: 02/15/2014  Attending Physician:Tayshun Gappa ALAN  Patient's DDU:[id number], Brian Reaper, Brian Villegas  Consults:  none  Discharge Diagnoses: Principal Problem:   Pancreatitis Mild hypokalemia Hypertension Hx polycythemia Fatty liver changes abnl LFT's improved Low albumin B12 deficiency with macrocytosis   Discharge Medications:   Medication List         ALPRAZolam 0.5 MG tablet  Commonly known as:  XANAX  Take 1 tablet by mouth as needed for anxiety.     AMBIEN 10 MG tablet  Generic drug:  zolpidem  Take 10 mg by mouth at bedtime as needed for sleep.     cyanocobalamin 1000 MCG/ML injection  Commonly known as:  (VITAMIN B-12)  Inject 100 mcg into the muscle every 30 (thirty) days.     diltiazem 120 MG 24 hr capsule  Commonly known as:  CARDIZEM CD  Take 120 mg by mouth daily.     losartan 25 MG tablet  Commonly known as:  COZAAR  Take 1 tablet (25 mg total) by mouth daily.     pantoprazole 20 MG tablet  Commonly known as:  PROTONIX  Take 20 mg by mouth daily.     potassium chloride SA 20 MEQ tablet  Commonly known as:  K-DUR,KLOR-CON  Take 1 tablet (20 mEq total) by mouth daily.        Hospital Procedures: US Abdomen Complete  02/12/2014   CLINICAL DATA:  One-day history of upper back and abdominal pain with nausea and vomiting  EXAM: ULTRASOUND ABDOMEN COMPLETE  COMPARISON:  Abdominal ultrasound of September 30, 2010  FINDINGS: Gallbladder:  No gallstones or wall thickening visualized. No sonographic Murphy sign noted.  Common bile duct:  Diameter: 3 mm  Liver:  No focal lesion identified. The hepatic echotexture is increased consistent with fatty infiltrative change. No intrahepatic ductal dilation is demonstrated.  IVC:  Bowel gas limits evaluation of the IVC.  Pancreas:  The pancreas is largely obscured by bowel gas.  Spleen:  Size and appearance  within normal limits.  Right Kidney:  Length: 12 cm. Echogenicity within normal limits. In the lateral midpole cortex a stable hypoechoic focus with enhanced through transmission most compatible with a 2.1 cm diameter cyst. There is no hydronephrosis.  Left Kidney:  Length: 11.9 cm. Echogenicity within normal limits. No mass or hydronephrosis visualized.  Abdominal aorta:  Bowel gas limits evaluation of the proximal abdominal aorta. The mid and distal aorta are normal in appearance.  Other findings:  None.  IMPRESSION: There is no acute hepatobiliary abnormality. There are fatty infiltrative changes of the liver and evaluation of the pancreas is limited by bowel gas. There is a stable right renal cyst.   Electronically Signed   By: David  Martinique   On: 02/12/2014 12:28    History of Present Illness: Abdominal pain  Hospital Course: 68 YO WM with hx hypertension and polycythemia, presented with pancreatitis. Lipase was over 2000 and transaminases and bilirubin were elevated. He was treated with hydration, pain control and bowel rest. Labs have progressively improved. Pancreatic enzymes are nearly normal and most LFT's are fine. Diet has been advanced and he has had no abdominal pain or nausea. BP meds were held and BP was up last night but is better today. He has had 2 good BM"s. He has had no withdrawal sxs and it does not appear he has needed benzodiazepine Rx. His electrolytes are good other  than a mild low K+ and his acidosis has resolved. Albumin has dropped. The exact cause of his pancreatitis is not certain but probably includes, NSAID's and alcohol as precipitating factors. He does have fatty liver on [place] and the persistantly high bilirubin may indicate more liver dysfunction. This is to be determined as an outpatient.    Day of Discharge Exam BP 130/77  Pulse 76  Temp(Src) 98.5 F (36.9 C) (Oral)  Resp 17  Ht 6\' 2"  (1.88 m)  Wt 98.4 kg (216 lb 14.9 oz)  BMI 27.84 kg/m2  SpO2 94%  Physical  Exam: General appearance: sitting up in no distress.  Eyes: no scleral icterus, anicteric Throat: moist oral membranes Resp: clear with no wheezes, rales or rhonchi Cardio: regular distant GI: obese soft, non-tender over epigatrium; bowel sounds normal; no masses,  no organomegaly appreciated Extremities: no clubbing, cyanosis or edema, strong pulses Awake, alert, mentating well. No tremor. No hallucinations or delusions, calm, conversant, rational  Discharge Labs:  Recent Labs  02/14/14 0456 02/15/14 0537  NA 138 137  K 3.4* 3.3*  CL 100 98  CO2 23 19  GLUCOSE 80 78  BUN 7 7  CREATININE 0.85 0.74  CALCIUM 8.1* 8.3*    Recent Labs  02/14/14 0456 02/15/14 0537  AST 62* 60*  ALT 42 35  ALKPHOS 82 83  BILITOT 1.7* 1.5*  PROT 5.9* 6.0  ALBUMIN 2.7* 2.6*    Recent Labs  02/14/14 0456 02/15/14 0537  WBC 9.3 9.2  HGB 13.1 12.9*  HCT 39.2 37.4*  MCV 105.4* 103.9*  PLT 131* 149*    Results for Brian Villegas, Brian Villegas (MRN [id number]) as of 02/15/2014 11:27  Ref. Range 02/13/2014 05:20 02/14/2014 04:56 02/15/2014 05:37  Alkaline Phosphatase Latest Range: 39-117 U/L 79 82 83  Albumin Latest Range: 3.5-5.2 g/dL 3.3 (L) 2.7 (L) 2.6 (L)  Amylase Latest Range: 0-105 U/L 204 (H) 49 35  Lipase Latest Range: 11-59 U/L 743 (H) 68 (H) 61 (H)  AST Latest Range: 0-37 U/L 71 (H) 62 (H) 60 (H)  ALT Latest Range: 0-53 U/L 51 42 35  Total Protein Latest Range: 6.0-8.3 g/dL 6.5 5.9 (L) 6.0  Total Bilirubin Latest Range: 0.3-1.2 mg/dL 1.6 (H) 1.7 (H) 1.5 (H)   Results for Brian Villegas, Brian Villegas (MRN [id number]) as of 02/15/2014 11:29  Ref. Range 02/12/2014 09:45 02/12/2014 09:46  Lipase Latest Range: 11-59 U/L 2054 (H)   AST Latest Range: 0-37 U/L 111 (H)   ALT Latest Range: 0-53 U/L 80 (H)   Total Protein Latest Range: 6.0-8.3 g/dL 7.1   Total Bilirubin Latest Range: 0.3-1.2 mg/dL 1.9 (H)   Lactic Acid, Venous Latest Range: 0.5-2.2 mmol/L  2.54 (H)     Discharge instructions: avoid all NSAID's, all  tylenol, all alcohol, all dietary supplemnts   Disposition: home  Follow-up Appts: Follow-up with Dr. Ardeth Perfect at [facility] this weeks.  Call for appointment.  Condition on Discharge: improved  Tests Needing Follow-up: none  Signed: Shelvy Heckert ALAN 02/15/2014, 11:24 AM

## 2014-02-15 NOTE — Progress Notes (Signed)
Nsg Discharge Note  Admit Date:  02/12/2014 Discharge date: 02/15/2014   Frederick Peers to be D/C'd Home per MD order.  AVS completed.  Copy for chart, and copy for patient signed, and dated. Patient/caregiver able to verbalize understanding.  Discharge Medication:   Medication List         ALPRAZolam 0.5 MG tablet  Commonly known as:  XANAX  Take 1 tablet by mouth as needed for anxiety.     AMBIEN 10 MG tablet  Generic drug:  zolpidem  Take 10 mg by mouth at bedtime as needed for sleep.     cyanocobalamin 1000 MCG/ML injection  Commonly known as:  (VITAMIN B-12)  Inject 100 mcg into the muscle every 30 (thirty) days.     diltiazem 120 MG 24 hr capsule  Commonly known as:  CARDIZEM CD  Take 120 mg by mouth daily.     losartan 25 MG tablet  Commonly known as:  COZAAR  Take 1 tablet (25 mg total) by mouth daily.     pantoprazole 20 MG tablet  Commonly known as:  PROTONIX  Take 20 mg by mouth daily.     potassium chloride SA 20 MEQ tablet  Commonly known as:  K-DUR,KLOR-CON  Take 1 tablet (20 mEq total) by mouth daily.        Discharge Assessment: Filed Vitals:   02/15/14 1438  BP: 157/80  Pulse: 74  Temp: 98.4 F (36.9 C)  Resp: 18   Skin clean, dry and intact without evidence of skin break down, no evidence of skin tears noted. IV catheter discontinued intact. Site without signs and symptoms of complications - no redness or edema noted at insertion site, patient denies c/o pain - only slight tenderness at site.  Dressing with slight pressure applied.  D/c Instructions-Education: Discharge instructions given to patient/family with verbalized understanding. D/c education completed with patient/family including follow up instructions, medication list, d/c activities limitations if indicated, with other d/c instructions as indicated by MD - patient able to verbalize understanding, all questions fully answered. Patient instructed to return to ED, call 911, or call MD  for any changes in condition.  Patient escorted via [place], and D/C home via private auto.  Alain Deschene, Elissa Hefty, RN 02/15/2014 3:23 PM

## 2014-04-13 ENCOUNTER — Other Ambulatory Visit: Payer: Self-pay | Admitting: *Deleted

## 2014-04-13 MED ORDER — DILTIAZEM HCL ER COATED BEADS 120 MG PO CP24: 120.0000 mg | ORAL_CAPSULE | Freq: Every day | ORAL | Status: DC

## 2014-05-06 ENCOUNTER — Ambulatory Visit: Payer: Medicare Other | Admitting: Cardiology

## 2014-05-13 ENCOUNTER — Other Ambulatory Visit: Payer: Self-pay | Admitting: Cardiology

## 2014-05-27 ENCOUNTER — Ambulatory Visit (INDEPENDENT_AMBULATORY_CARE_PROVIDER_SITE_OTHER): Payer: Medicare Other | Admitting: Cardiology

## 2014-05-27 VITALS — BP 132/78 | HR 76 | Ht 74.0 in | Wt 209.0 lb

## 2014-05-27 DIAGNOSIS — G4700 Insomnia, unspecified: Secondary | ICD-10-CM

## 2014-05-27 DIAGNOSIS — I119 Hypertensive heart disease without heart failure: Secondary | ICD-10-CM

## 2014-05-27 MED ORDER — ZOLPIDEM TARTRATE 10 MG PO TABS: 10.0000 mg | ORAL_TABLET | Freq: Every evening | ORAL | Status: AC | PRN

## 2014-05-27 NOTE — Assessment & Plan Note (Signed)
The patient denies any chest pain or shortness of breath.  He does not get any intentional exercise during the summer because of the hot weather.  He intends to start walking more now that the weather is cooler

## 2014-05-27 NOTE — Assessment & Plan Note (Signed)
The patient has been haviing problems with insomnia.  We refilled his ambien at his request.

## 2014-05-27 NOTE — Progress Notes (Signed)
Brian Villegas Date of Birth:  May 01, 1946 [facility] [address] [place], [place]  [address] [phone]        Fax   [phone]   History of Present Illness: This pleasant 68 year old gentleman is seen for a followup office visit. He has a history of essential hypertension. . The patient had a nuclear stress test on 09/01/10 which showed no ischemia and which showed an ejection fraction of 49% and a hypertensive blood pressure response to exercise. More recently the patient had a followup echocardiogram on 10/24/11 which showed that his ejection fraction remains normal at 55-60% and he has grade 1 diastolic dysfunction at his last office visit 2 months ago we reduced his diltiazem to once a day and added losartan 25 mg daily. He has felt well on this new regimen and his blood pressure has been more satisfactory.  Since last office visit the patient went to the emergency room on the evening of 01/31/13 after having an episode of syncope or possibly falling at home in the middle of the night he suffered a broken nose and was told that he had a concussion he subsequently saw his neurosurgeon Dr. Sherley Bounds he had a CT angiogram which did not show any evidence of pulmonary embolus he was found to have a esophageal ring on barium swallow. He has seen Dr. Cristina Gong. No surgery is planned for the near future.  Since we last saw the patient he was hospitalized for 4 days with acute pancreatitis.  He has made a full recovery of symptoms.   Current Outpatient Prescriptions  Medication Sig Dispense Refill  . ALPRAZolam (XANAX) 0.5 MG tablet Take 1 tablet by mouth as needed for anxiety.       [name] CARTIA XT 120 MG 24 hr capsule take 1 capsule by mouth once daily  30 capsule  0  . cyanocobalamin (,VITAMIN B-12,) 1000 MCG/ML injection Inject 100 mcg into the muscle every 30 (thirty) days.       [name] losartan (COZAAR) 25 MG tablet Take 1 tablet (25 mg total) by mouth daily.  60 tablet  6    . pantoprazole (PROTONIX) 20 MG tablet Take 20 mg by mouth daily.      [name] zolpidem (AMBIEN) 10 MG tablet Take 10 mg by mouth at bedtime as needed for sleep.       . cephALEXin (KEFLEX) 500 MG capsule       . chlorhexidine (PERIDEX) 0.12 % solution       . oxyCODONE (OXY IR/ROXICODONE) 5 MG immediate release tablet       . potassium chloride SA (K-DUR,KLOR-CON) 20 MEQ tablet Take 1 tablet (20 mEq total) by mouth daily.  14 tablet  0   No current facility-administered medications for this visit.    Allergies  Allergen Reactions  . Amlodipine Other (See Comments)    Cough   . Bystolic [Nebivolol Hcl]     Cough   . Codeine Itching and Nausea And Vomiting  . Lisinopril     cough  . Losartan Other (See Comments)    Fatigue, felt bad Nausea and dizziness  . Micardis Hct [Telmisartan-Hctz]     Cough, anxious  . Olmesartan Other (See Comments)  . Toprol Xl [Metoprolol Succinate]     cough  . Triamterene     Cough     Patient Active Problem List   Diagnosis Date Noted  . Pancreatitis 02/12/2014  . Concussion with loss of  consciousness 06/18/2013  . Dyspnea on exertion 06/04/2013  . Polycythemia 06/04/2013  . Cerebral aneurysm 06/04/2013  . Dysphagia 04/07/2013  . Benign hypertensive heart disease without heart failure 01/31/2011  . Malaise and fatigue 01/31/2011    History  Smoking status  . Never Smoker   Smokeless tobacco  . Never Used    History  Alcohol Use  . 4.2 oz/week  . 7 Shots of liquor per week    Comment: 02/12/2014 "1 shot of vodka nightly x 20years"    Family History  Problem Relation Age of Onset  . Other Mother     Died, 90 complicated from esophageal stricutre  . Other Father     Died, 26 from carbon monoxide poisoning  . Healthy Brother   . Healthy Daughter     Review of Systems: Constitutional: no fever chills diaphoresis or fatigue or change in weight.  Head and neck: no hearing loss, no epistaxis, no photophobia or visual  disturbance. Respiratory: No cough, shortness of breath or wheezing. Cardiovascular: No chest pain peripheral edema, palpitations. Gastrointestinal: No abdominal distention, no abdominal pain, no change in bowel habits hematochezia or melena. Genitourinary: No dysuria, no frequency, no urgency, no nocturia. Musculoskeletal:No arthralgias, no back pain, no gait disturbance or myalgias. Neurological: No dizziness, no headaches, no numbness, no seizures, no syncope, no weakness, no tremors. Hematologic: No lymphadenopathy, no easy bruising. Psychiatric: No confusion, no hallucinations, no sleep disturbance.    Physical Exam: Filed Vitals:   05/27/14 1453  BP: 132/78  Pulse: 76  The patient appears to be in no distress.  Head and neck exam reveals that the pupils are equal and reactive.  The extraocular movements are full.  There is no scleral icterus.  Mouth and pharynx are benign.  No lymphadenopathy.  No carotid bruits.  The jugular venous pressure is normal.  Thyroid is not enlarged or tender.  Chest is clear to percussion and auscultation.  No rales or rhonchi.  Expansion of the chest is symmetrical.  Heart reveals no abnormal lift or heave.  First and second heart sounds are normal.  There is no murmur gallop rub or click.  The abdomen is soft and nontender.  Bowel sounds are normoactive.  There is no hepatosplenomegaly or mass.  There are no abdominal bruits.  Extremities reveal no phlebitis or edema.  Pedal pulses are good.  There is no cyanosis or clubbing.  Neurologic exam is normal strength and no lateralizing weakness.  No sensory deficits.  Integument reveals no rash    Assessment / Plan: 1. Essential hypertension. 2. Insomnia 3. Recent acute pancreatitis.  Continue current meds. Recheck in 6 months.

## 2014-05-27 NOTE — Patient Instructions (Signed)
Your physician recommends that you continue on your current medications as directed. Please refer to the Current Medication list given to you today.  Your physician wants you to follow-up in: 6 month ov You will receive a reminder letter in the mail two months in advance. If you don't receive a letter, please call our office to schedule the follow-up appointment.  

## 2014-06-10 ENCOUNTER — Other Ambulatory Visit: Payer: Self-pay | Admitting: Cardiology

## 2014-07-11 ENCOUNTER — Encounter (HOSPITAL_BASED_OUTPATIENT_CLINIC_OR_DEPARTMENT_OTHER): Payer: Self-pay | Admitting: *Deleted

## 2014-07-11 ENCOUNTER — Emergency Department (HOSPITAL_BASED_OUTPATIENT_CLINIC_OR_DEPARTMENT_OTHER): Payer: Medicare Other

## 2014-07-11 ENCOUNTER — Emergency Department (HOSPITAL_BASED_OUTPATIENT_CLINIC_OR_DEPARTMENT_OTHER)
Admission: EM | Admit: 2014-07-11 | Discharge: 2014-07-11 | Disposition: A | Discharge status: Home / Self Care | Payer: Medicare Other | Attending: Emergency Medicine | Admitting: Emergency Medicine

## 2014-07-11 DIAGNOSIS — Z9889 Other specified postprocedural states: Secondary | ICD-10-CM | POA: Diagnosis not present

## 2014-07-11 DIAGNOSIS — Y929 Unspecified place or not applicable: Secondary | ICD-10-CM | POA: Insufficient documentation

## 2014-07-11 DIAGNOSIS — W0110XA Fall on same level from slipping, tripping and stumbling with subsequent striking against unspecified object, initial encounter: Secondary | ICD-10-CM | POA: Diagnosis not present

## 2014-07-11 DIAGNOSIS — S29001A Unspecified injury of muscle and tendon of front wall of thorax, initial encounter: Secondary | ICD-10-CM | POA: Diagnosis present

## 2014-07-11 DIAGNOSIS — Z87828 Personal history of other (healed) physical injury and trauma: Secondary | ICD-10-CM | POA: Insufficient documentation

## 2014-07-11 DIAGNOSIS — F419 Anxiety disorder, unspecified: Secondary | ICD-10-CM | POA: Insufficient documentation

## 2014-07-11 DIAGNOSIS — Z7982 Long term (current) use of aspirin: Secondary | ICD-10-CM | POA: Diagnosis not present

## 2014-07-11 DIAGNOSIS — Z79899 Other long term (current) drug therapy: Secondary | ICD-10-CM | POA: Insufficient documentation

## 2014-07-11 DIAGNOSIS — Z85828 Personal history of other malignant neoplasm of skin: Secondary | ICD-10-CM | POA: Diagnosis not present

## 2014-07-11 DIAGNOSIS — Z792 Long term (current) use of antibiotics: Secondary | ICD-10-CM | POA: Diagnosis not present

## 2014-07-11 DIAGNOSIS — Z791 Long term (current) use of non-steroidal anti-inflammatories (NSAID): Secondary | ICD-10-CM | POA: Diagnosis not present

## 2014-07-11 DIAGNOSIS — S2231XA Fracture of one rib, right side, initial encounter for closed fracture: Secondary | ICD-10-CM | POA: Diagnosis not present

## 2014-07-11 DIAGNOSIS — T1490XA Injury, unspecified, initial encounter: Secondary | ICD-10-CM

## 2014-07-11 DIAGNOSIS — D45 Polycythemia vera: Secondary | ICD-10-CM | POA: Diagnosis not present

## 2014-07-11 DIAGNOSIS — I10 Essential (primary) hypertension: Secondary | ICD-10-CM | POA: Insufficient documentation

## 2014-07-11 DIAGNOSIS — K219 Gastro-esophageal reflux disease without esophagitis: Secondary | ICD-10-CM | POA: Diagnosis not present

## 2014-07-11 DIAGNOSIS — Z8639 Personal history of other endocrine, nutritional and metabolic disease: Secondary | ICD-10-CM | POA: Insufficient documentation

## 2014-07-11 DIAGNOSIS — Y939 Activity, unspecified: Secondary | ICD-10-CM | POA: Insufficient documentation

## 2014-07-11 MED ORDER — OXYCODONE HCL 5 MG PO TABS
10.0000 mg | ORAL_TABLET | Freq: Once | ORAL | Status: AC
  Administered 2014-07-11: 10 mg via ORAL
  Filled 2014-07-11: qty 2

## 2014-07-11 MED ORDER — ONDANSETRON 4 MG PO TBDP
4.0000 mg | ORAL_TABLET | Freq: Once | ORAL | Status: AC
  Administered 2014-07-11: 4 mg via ORAL
  Filled 2014-07-11: qty 1

## 2014-07-11 MED ORDER — OXYCODONE-ACETAMINOPHEN 5-325 MG PO TABS
2.0000 | ORAL_TABLET | Freq: Once | ORAL | Status: AC
  Administered 2014-07-11: 2 via ORAL
  Filled 2014-07-11: qty 2

## 2014-07-11 MED ORDER — ONDANSETRON HCL 4 MG PO TABS: 4.0000 mg | ORAL_TABLET | Freq: Four times a day (QID) | ORAL | Status: DC

## 2014-07-11 MED ORDER — FENTANYL CITRATE 0.05 MG/ML IJ SOLN
50.0000 ug | Freq: Once | INTRAMUSCULAR | Status: AC
Start: 2014-07-11 — End: 2014-07-11
  Administered 2014-07-11: 50 ug via NASAL
  Filled 2014-07-11: qty 2

## 2014-07-11 MED ORDER — ONDANSETRON HCL 4 MG/2ML IJ SOLN: 4.0000 mg | Freq: Once | INTRAMUSCULAR | Status: DC

## 2014-07-11 MED ORDER — NAPROXEN 375 MG PO TABS: 375.0000 mg | ORAL_TABLET | Freq: Two times a day (BID) | ORAL | Status: DC

## 2014-07-11 MED ORDER — OXYCODONE HCL 5 MG PO TABS: 5.0000 mg | ORAL_TABLET | Freq: Four times a day (QID) | ORAL | Status: DC | PRN

## 2014-07-11 NOTE — ED Notes (Signed)
Patient states that he was up at 4am and fell, hitting his right side. Now c/o right rib area pain.

## 2014-07-11 NOTE — Discharge Instructions (Signed)

## 2014-07-11 NOTE — ED Provider Notes (Signed)
CSN: [id number]     Arrival date & time 07/11/14  1004 History   First MD Initiated Contact with Patient 07/11/14 1205     Chief Complaint  Patient presents with  . Fall     (Consider location/radiation/quality/duration/timing/severity/associated sxs/prior Treatment) Patient is a 68 y.o. male presenting with fall. The history is provided by the patient. No language interpreter was used.  Fall This is a new problem. The current episode started 6 to 12 hours ago. The problem occurs constantly. The problem has not changed since onset.Associated symptoms include chest pain. Pertinent negatives include no abdominal pain, no headaches and no shortness of breath. Exacerbated by: coughing, twisting. Nothing relieves the symptoms. He has tried nothing for the symptoms. The treatment provided no relief.    Past Medical History  Diagnosis Date  . Hypertension     multiple medication intolerances  . Polycythemia     requires phlebotomies   . Alcohol consuption of more than two drinks per day   . Memory loss     "since fall w/concussion in 06/2013" (02/12/2014)  . Concussion with loss of consciousness 06/18/2013  . GERD (gastroesophageal reflux disease)   . Constipation   . Basal cell carcinoma ~ 2012    "MOHS on right neck"  . High cholesterol     "creeping up; not taking RX yet" (02/12/2014)  . Hepatitis 1952    "yellow jaundice"  . Headache(784.0)     "5d X wk since fall w/concussion in 06/2013" (02/12/2014)  . Anxiety    Past Surgical History  Procedure Laterality Date  . US echocardiography  2008    EF 50-55% , trivial AR,hypokinesis of mid inferior wall  . Nuclear stress test  08/2010    EF 49%, Normal  . Cardiac catheterization  2008  . Anterior cervical decomp/discectomy fusion  2010  . Appendectomy    . Shoulder arthroscopy Left ?2007    "labrium repair"  . Cataract extraction w/ intraocular lens  implant, bilateral Bilateral 1990's  . Orif clavicle fracture Left 2006   using DePuy clavicle pin.  . Clavicle hardware removal Left 2007  . Mohs surgery Right ~ 2012    "neck; basal cell"   Family History  Problem Relation Age of Onset  . Other Mother     Died, 90 complicated from esophageal stricutre  . Other Father     Died, 30 from carbon monoxide poisoning  . Healthy Brother   . Healthy Daughter    History  Substance Use Topics  . Smoking status: Never Smoker   . Smokeless tobacco: Never Used  . Alcohol Use: 4.2 oz/week    7 Shots of liquor per week     Comment: 02/12/2014 "1 shot of vodka nightly x 20years"    Review of Systems  Constitutional: Negative for fever, activity change, appetite change and fatigue.  HENT: Negative for congestion, facial swelling, rhinorrhea and trouble swallowing.   Eyes: Negative for photophobia and pain.  Respiratory: Negative for cough, chest tightness and shortness of breath.   Cardiovascular: Positive for chest pain. Negative for leg swelling.  Gastrointestinal: Negative for nausea, vomiting, abdominal pain, diarrhea and constipation.  Endocrine: Negative for polydipsia and polyuria.  Genitourinary: Negative for dysuria, urgency, decreased urine volume and difficulty urinating.  Musculoskeletal: Negative for back pain and gait problem.  Skin: Negative for color change, rash and wound.  Allergic/Immunologic: Negative for immunocompromised state.  Neurological: Negative for dizziness, facial asymmetry, speech difficulty, weakness, numbness and headaches.  Psychiatric/Behavioral:  Negative for confusion, decreased concentration and agitation.      Allergies  Amlodipine; Bystolic; Codeine; Lisinopril; Losartan; Micardis hct; Olmesartan; Toprol xl; and Triamterene  Home Medications   Prior to Admission medications   Medication Sig Start Date End Date Taking? Authorizing Provider  ALPRAZolam Duanne Moron) 0.5 MG tablet Take 1 tablet by mouth as needed for anxiety.  01/29/11   Historical Provider, MD  CARTIA XT 120  MG 24 hr capsule take 1 capsule by mouth once daily 06/11/14   Darlin Coco, MD  cephALEXin [facility]) 500 MG capsule  04/22/14   Historical Provider, MD  chlorhexidine (PERIDEX) 0.12 % solution  05/19/14   Historical Provider, MD  cyanocobalamin (,VITAMIN B-12,) 1000 MCG/ML injection Inject 100 mcg into the muscle every 30 (thirty) days.  04/16/12   Historical Provider, MD  losartan (COZAAR) 25 MG tablet Take 1 tablet (25 mg total) by mouth daily. 09/05/13   Darlin Coco, MD  naproxen (NAPROSYN) 375 MG tablet Take 1 tablet (375 mg total) by mouth 2 (two) times daily. 07/11/14   Ernestina Patches, MD  ondansetron (ZOFRAN) 4 MG tablet Take 1 tablet (4 mg total) by mouth every 6 (six) hours. 07/11/14   Ernestina Patches, MD  oxyCODONE (ROXICODONE) 5 MG immediate release tablet Take 1-2 tablets (5-10 mg total) by mouth every 6 (six) hours as needed for severe pain. 07/11/14   Ernestina Patches, MD  pantoprazole (PROTONIX) 20 MG tablet Take 20 mg by mouth daily.    Historical Provider, MD  potassium chloride SA (K-DUR,KLOR-CON) 20 MEQ tablet Take 1 tablet (20 mEq total) by mouth daily. 02/15/14   Sheela Stack, MD  zolpidem (AMBIEN) 10 MG tablet Take 1 tablet (10 mg total) by mouth at bedtime as needed for sleep. 05/27/14   Darlin Coco, MD   BP 161/88 mmHg  Pulse 90  Temp(Src) 97.6 F (36.4 C) (Oral)  Resp 16  SpO2 100% Physical Exam  Constitutional: He is oriented to person, place, and time. He appears well-developed and well-nourished. No distress.  HENT:  Head: Normocephalic and atraumatic.  Mouth/Throat: No oropharyngeal exudate.  Eyes: Pupils are equal, round, and reactive to light.  Neck: Normal range of motion. Neck supple.  Cardiovascular: Normal rate, regular rhythm and normal heart sounds.  Exam reveals no gallop and no friction rub.   No murmur heard. Pulmonary/Chest: Effort normal and breath sounds normal. No respiratory distress. He has no wheezes. He has no rales.    Abdominal:  Soft. Bowel sounds are normal. He exhibits no distension and no mass. There is no tenderness. There is no rebound and no guarding.  Musculoskeletal: Normal range of motion. He exhibits no edema or tenderness.  Neurological: He is alert and oriented to person, place, and time.  Skin: Skin is warm and dry.  Psychiatric: He has a normal mood and affect.    ED Course  Procedures (including critical care time) Labs Review Labs Reviewed - No data to display  Imaging Review Dg Chest 2 View  07/11/2014   CLINICAL DATA:  68 year old male with right chest pain after falling this morning on to a chair.  EXAM: CHEST  2 VIEW  COMPARISON:  Prior chest x-ray 01/31/2013; prior chest CT 01/31/2013  FINDINGS: Cardiac and mediastinal contours are within normal limits and unchanged. No evidence of pneumothorax, pleural effusion or focal airspace consolidation. No pulmonary edema. Acute minimally displaced fracture of the lateral aspect of the right ninth rib. Incompletely imaged and tear ear cervical fixation hardware spanning  C4-C6.  IMPRESSION: 1. Acute minimally displaced fracture of the lateral aspect of the right ninth rib. 2. No acute cardiopulmonary process.   Electronically Signed   By: Jacqulynn Cadet M.D.   On: 07/11/2014 11:00     EKG Interpretation None      MDM   Final diagnoses:  Rib fracture, right, closed, initial encounter    Pt is a 68 y.o. male with Pmhx as above who presents with R inferior-lateral chest wall pain after a mechanical fall last night. CXR with nondisplaced R rib frac. Pt given PO percocet, but then have epsiode of vomiting, but was shortly after able to tolerate PO Roxicodone (while codene is on his allergy list, pt states he takes percocet at home occasionally). Pt will be d/c'd home w/ roxicodone, and naproxen and incentive spirometer. Return precautions given for new or worsening symptoms including worsening pain, fever, SOB.          Ernestina Patches,  MD 07/11/14 2116

## 2014-07-11 NOTE — ED Notes (Signed)
After giving pt Percocet, he vomited immediately, intact pills noted in emesis bag - MD notified and will reorder pain meds

## 2014-08-06 ENCOUNTER — Telehealth: Payer: Self-pay | Admitting: Cardiology

## 2014-08-06 NOTE — Telephone Encounter (Signed)
Called patient and explained that I had not called and did not see a note that someone tried to call patient Patient stated no message left Scheduled recall appointment

## 2014-08-06 NOTE — Telephone Encounter (Signed)
New Msg   Pt returning call. Please contact at [phone].

## 2014-08-13 ENCOUNTER — Other Ambulatory Visit: Payer: Self-pay | Admitting: Internal Medicine

## 2014-08-13 DIAGNOSIS — R1011 Right upper quadrant pain: Secondary | ICD-10-CM

## 2014-08-13 DIAGNOSIS — R634 Abnormal weight loss: Secondary | ICD-10-CM

## 2014-08-13 DIAGNOSIS — R945 Abnormal results of liver function studies: Principal | ICD-10-CM

## 2014-08-13 DIAGNOSIS — R7989 Other specified abnormal findings of blood chemistry: Secondary | ICD-10-CM

## 2014-08-14 ENCOUNTER — Other Ambulatory Visit: Payer: Self-pay | Admitting: Internal Medicine

## 2014-08-14 ENCOUNTER — Ambulatory Visit
Admission: RE | Admit: 2014-08-14 | Discharge: 2014-08-14 | Disposition: A | Discharge status: Home / Self Care | Payer: Medicare Other | Source: Ambulatory Visit | Attending: Internal Medicine | Admitting: Internal Medicine

## 2014-08-14 DIAGNOSIS — R945 Abnormal results of liver function studies: Secondary | ICD-10-CM

## 2014-08-14 DIAGNOSIS — R634 Abnormal weight loss: Secondary | ICD-10-CM

## 2014-08-14 DIAGNOSIS — R7989 Other specified abnormal findings of blood chemistry: Secondary | ICD-10-CM

## 2014-08-14 DIAGNOSIS — R1011 Right upper quadrant pain: Secondary | ICD-10-CM

## 2014-08-14 MED ORDER — IOHEXOL 300 MG/ML  SOLN
125.0000 mL | Freq: Once | INTRAMUSCULAR | Status: AC | PRN
  Administered 2014-08-14: 125 mL via INTRAVENOUS

## 2014-08-31 ENCOUNTER — Emergency Department (INDEPENDENT_AMBULATORY_CARE_PROVIDER_SITE_OTHER)
Admission: EM | Admit: 2014-08-31 | Discharge: 2014-08-31 | Disposition: A | Discharge status: Home / Self Care | Payer: Medicare Other | Source: Home / Self Care | Attending: Family Medicine | Admitting: Family Medicine

## 2014-08-31 ENCOUNTER — Encounter (HOSPITAL_COMMUNITY): Payer: Self-pay | Admitting: Emergency Medicine

## 2014-08-31 DIAGNOSIS — M10071 Idiopathic gout, right ankle and foot: Secondary | ICD-10-CM

## 2014-08-31 DIAGNOSIS — M109 Gout, unspecified: Secondary | ICD-10-CM

## 2014-08-31 MED ORDER — COLCHICINE 0.6 MG PO TABS: 0.6000 mg | ORAL_TABLET | Freq: Every day | ORAL | Status: DC

## 2014-08-31 MED ORDER — OXYCODONE-ACETAMINOPHEN 5-325 MG PO TABS: 1.0000 | ORAL_TABLET | Freq: Four times a day (QID) | ORAL | Status: DC | PRN

## 2014-08-31 NOTE — ED Provider Notes (Signed)
Brian Villegas is a 68 y.o. male who presents to Urgent Care today for right foot pain and swelling. Symptoms present for 3 days. Symptoms consistent with previous episodes of gout. Patient denies any injury. No fevers or chills. He has tried ibuprofen which did not work well.   Past Medical History  Diagnosis Date  . Hypertension     multiple medication intolerances  . Polycythemia     requires phlebotomies   . Alcohol consuption of more than two drinks per day   . Memory loss     "since fall w/concussion in 06/2013" (02/12/2014)  . Concussion with loss of consciousness 06/18/2013  . GERD (gastroesophageal reflux disease)   . Constipation   . Basal cell carcinoma ~ 2012    "MOHS on right neck"  . High cholesterol     "creeping up; not taking RX yet" (02/12/2014)  . Hepatitis 1952    "yellow jaundice"  . Headache(784.0)     "5d X wk since fall w/concussion in 06/2013" (02/12/2014)  . Anxiety    Past Surgical History  Procedure Laterality Date  . US echocardiography  2008    EF 50-55% , trivial AR,hypokinesis of mid inferior wall  . Nuclear stress test  08/2010    EF 49%, Normal  . Cardiac catheterization  2008  . Anterior cervical decomp/discectomy fusion  2010  . Appendectomy    . Shoulder arthroscopy Left ?2007    "labrium repair"  . Cataract extraction w/ intraocular lens  implant, bilateral Bilateral 1990's  . Orif clavicle fracture Left 2006    using DePuy clavicle pin.  . Clavicle hardware removal Left 2007  . Mohs surgery Right ~ 2012    "neck; basal cell"   History  Substance Use Topics  . Smoking status: Never Smoker   . Smokeless tobacco: Never Used  . Alcohol Use: 4.2 oz/week    7 Shots of liquor per week     Comment: 02/12/2014 "1 shot of vodka nightly x 20years"   ROS as above Medications: No current facility-administered medications for this encounter.   Current Outpatient Prescriptions  Medication Sig Dispense Refill  . ALPRAZolam (XANAX) 0.5 MG  tablet Take 1 tablet by mouth as needed for anxiety.     [name] CARTIA XT 120 MG 24 hr capsule take 1 capsule by mouth once daily 30 capsule 11  . cyanocobalamin (,VITAMIN B-12,) 1000 MCG/ML injection Inject 100 mcg into the muscle every 30 (thirty) days.     [name] losartan (COZAAR) 25 MG tablet Take 1 tablet (25 mg total) by mouth daily. 60 tablet 6  . pantoprazole (PROTONIX) 20 MG tablet Take 20 mg by mouth daily.    [name] zolpidem (AMBIEN) 10 MG tablet Take 1 tablet (10 mg total) by mouth at bedtime as needed for sleep. 30 tablet 5  . colchicine 0.6 MG tablet Take 1 tablet (0.6 mg total) by mouth daily. 30 tablet 1  . oxyCODONE-acetaminophen (PERCOCET/ROXICET) 5-325 MG per tablet Take 1-2 tablets by mouth every 6 (six) hours as needed for severe pain. 20 tablet 0  . [DISCONTINUED] potassium chloride SA (K-DUR,KLOR-CON) 20 MEQ tablet Take 1 tablet (20 mEq total) by mouth daily. 14 tablet 0   Allergies  Allergen Reactions  . Amlodipine Other (See Comments)    Cough   . Bystolic [Nebivolol Hcl]     Cough   . Codeine Itching and Nausea And Vomiting  . Lisinopril     cough  . Losartan Other (See Comments)  Fatigue, felt bad Nausea and dizziness  . Micardis Hct [Telmisartan-Hctz]     Cough, anxious  . Olmesartan Other (See Comments)  . Toprol Xl [Metoprolol Succinate]     cough  . Triamterene     Cough      Exam:  BP 139/97 mmHg  Pulse 102  Temp(Src) 98.1 F (36.7 C) (Oral)  Resp 18  SpO2 98% Gen: Well NAD Right foot and ankle erythematous tender and swollen. Capillary refill and pulses intact.  No results found for this or any previous visit (from the past 24 hour(s)). No results found.  Assessment and Plan: 68 y.o. male with gout attack. Treatment with colchicine and oxycodone. Follow-up with PCP.  Discussed warning signs or symptoms. Please see discharge instructions. Patient expresses understanding.     Gregor Hams, MD 08/31/14 [id number]

## 2014-08-31 NOTE — ED Notes (Signed)
Pt has been suffering from pain in his right root for two days. He has a history of gout in his left foot.

## 2014-08-31 NOTE — Discharge Instructions (Signed)
Thank you for coming in today. Take colchicine daily for gout flare. Stop taking this medication when the gout goes away.  You may get this medicine cheaper by going to [employer] website and searching for a coupon. Take oxycodone for severe pain. Do not drive after taking this medication.  Follow-up with primary care provider   Gout Gout is an inflammatory arthritis caused by a buildup of uric acid crystals in the joints. Uric acid is a chemical that is normally present in the blood. When the level of uric acid in the blood is too high it can form crystals that deposit in your joints and tissues. This causes joint redness, soreness, and swelling (inflammation). Repeat attacks are common. Over time, uric acid crystals can form into masses (tophi) near a joint, destroying bone and causing disfigurement. Gout is treatable and often preventable. CAUSES  The disease begins with elevated levels of uric acid in the blood. Uric acid is produced by your body when it breaks down a naturally found substance called purines. Certain foods you eat, such as meats and fish, contain high amounts of purines. Causes of an elevated uric acid level include:  Being passed down from parent to child (heredity).  Diseases that cause increased uric acid production (such as obesity, psoriasis, and certain cancers).  Excessive alcohol use.  Diet, especially diets rich in meat and seafood.  Medicines, including certain cancer-fighting medicines (chemotherapy), water pills (diuretics), and aspirin.  Chronic kidney disease. The kidneys are no longer able to remove uric acid well.  Problems with metabolism. Conditions strongly associated with gout include:  Obesity.  High blood pressure.  High cholesterol.  Diabetes. Not everyone with elevated uric acid levels gets gout. It is not understood why some people get gout and others do not. Surgery, joint injury, and eating too much of certain foods are some of the  factors that can lead to gout attacks. SYMPTOMS   An attack of gout comes on quickly. It causes intense pain with redness, swelling, and warmth in a joint.  Fever can occur.  Often, only one joint is involved. Certain joints are more commonly involved:  Base of the big toe.  Knee.  Ankle.  Wrist.  Finger. Without treatment, an attack usually goes away in a few days to weeks. Between attacks, you usually will not have symptoms, which is different from many other forms of arthritis. DIAGNOSIS  Your caregiver will suspect gout based on your symptoms and exam. In some cases, tests may be recommended. The tests may include:  Blood tests.  Urine tests.  X-rays.  Joint fluid exam. This exam requires a needle to remove fluid from the joint (arthrocentesis). Using a microscope, gout is confirmed when uric acid crystals are seen in the joint fluid. TREATMENT  There are two phases to gout treatment: treating the sudden onset (acute) attack and preventing attacks (prophylaxis).  Treatment of an Acute Attack.  Medicines are used. These include anti-inflammatory medicines or steroid medicines.  An injection of steroid medicine into the affected joint is sometimes necessary.  The painful joint is rested. Movement can worsen the arthritis.  You may use warm or cold treatments on painful joints, depending which works best for you.  Treatment to Prevent Attacks.  If you suffer from frequent gout attacks, your caregiver may advise preventive medicine. These medicines are started after the acute attack subsides. These medicines either help your kidneys eliminate uric acid from your body or decrease your uric acid production. You may  need to stay on these medicines for a very long time.  The early phase of treatment with preventive medicine can be associated with an increase in acute gout attacks. For this reason, during the first few months of treatment, your caregiver may also advise you  to take medicines usually used for acute gout treatment. Be sure you understand your caregiver's directions. Your caregiver may make several adjustments to your medicine dose before these medicines are effective.  Discuss dietary treatment with your caregiver or dietitian. Alcohol and drinks high in sugar and fructose and foods such as meat, poultry, and seafood can increase uric acid levels. Your caregiver or dietitian can advise you on drinks and foods that should be limited. HOME CARE INSTRUCTIONS   Do not take aspirin to relieve pain. This raises uric acid levels.  Only take over-the-counter or prescription medicines for pain, discomfort, or fever as directed by your caregiver.  Rest the joint as much as possible. When in bed, keep sheets and blankets off painful areas.  Keep the affected joint raised (elevated).  Apply warm or cold treatments to painful joints. Use of warm or cold treatments depends on which works best for you.  Use crutches if the painful joint is in your leg.  Drink enough fluids to keep your urine clear or pale yellow. This helps your body get rid of uric acid. Limit alcohol, sugary drinks, and fructose drinks.  Follow your dietary instructions. Pay careful attention to the amount of protein you eat. Your daily diet should emphasize fruits, vegetables, whole grains, and fat-free or low-fat milk products. Discuss the use of coffee, vitamin C, and cherries with your caregiver or dietitian. These may be helpful in lowering uric acid levels.  Maintain a healthy body weight. SEEK MEDICAL CARE IF:   You develop diarrhea, vomiting, or any side effects from medicines.  You do not feel better in 24 hours, or you are getting worse. SEEK IMMEDIATE MEDICAL CARE IF:   Your joint becomes suddenly more tender, and you have chills or a fever. MAKE SURE YOU:   Understand these instructions.  Will watch your condition.  Will get help right away if you are not doing well or  get worse. Document Released: 08/18/2000 Document Revised: 01/05/2014 Document Reviewed: 04/03/2012 [facility] Patient Information 2015 [place], [place]. This information is not intended to replace advice given to you by your health care provider. Make sure you discuss any questions you have with your health care provider.

## 2014-11-20 ENCOUNTER — Encounter: Payer: Self-pay | Admitting: Cardiology

## 2014-11-20 ENCOUNTER — Ambulatory Visit (INDEPENDENT_AMBULATORY_CARE_PROVIDER_SITE_OTHER): Payer: Medicare Other | Admitting: Cardiology

## 2014-11-20 VITALS — BP 124/80 | HR 92 | Ht 74.0 in | Wt 198.1 lb

## 2014-11-20 DIAGNOSIS — I119 Hypertensive heart disease without heart failure: Secondary | ICD-10-CM | POA: Diagnosis not present

## 2014-11-20 DIAGNOSIS — R0609 Other forms of dyspnea: Secondary | ICD-10-CM | POA: Diagnosis not present

## 2014-11-20 NOTE — Patient Instructions (Signed)
Your physician recommends that you continue on your current medications as directed. Please refer to the Current Medication list given to you today.  Your physician wants you to follow-up in: 6 months with fasting labs (lp/bmet/hfp)  You will receive a reminder letter in the mail two months in advance. If you don't receive a letter, please call our office to schedule the follow-up appointment.  

## 2014-11-20 NOTE — Progress Notes (Signed)
Cardiology Office Note   Date:  11/20/2014   ID:  Brian Villegas, DOB 24-Feb-1946, MRN [id number]  PCP:  Velna Hatchet, MD  Cardiologist:   Darlin Coco, MD   No chief complaint on file.     History of Present Illness: Brian Villegas is a 69 y.o. male who presents for a six-month follow-up office visit  This pleasant 68 year old gentleman is seen for a followup office visit. He has a history of essential hypertension. . The patient had a nuclear stress test on 09/01/10 which showed no ischemia and which showed an ejection fraction of 49% and a hypertensive blood pressure response to exercise. More recently the patient had a followup echocardiogram on 10/24/11 which showed that his ejection fraction remains normal at 55-60% and he has grade 1 diastolic dysfunction at his last office visit 2 months ago we reduced his diltiazem to once a day and added losartan 25 mg daily. He has felt well on this new regimen and his blood pressure has been more satisfactory.  Since last visit the patient has been doing well.  His appetite has been down some and he has lost about 10 pounds.  He is not getting much regular exercise.  He used to play a lot of racquetball but has not done so since he had his cervical fusion. He denies any chest pain or shortness of breath.  He has not been checking his blood pressure at home.  As had occasional dyspepsia for which he takes Pepcid.  He has a past history of pancreatitis but has not had any recurrent symptoms.   Past Medical History  Diagnosis Date  . Hypertension     multiple medication intolerances  . Polycythemia     requires phlebotomies   . Alcohol consuption of more than two drinks per day   . Memory loss     "since fall w/concussion in 06/2013" (02/12/2014)  . Concussion with loss of consciousness 06/18/2013  . GERD (gastroesophageal reflux disease)   . Constipation   . Basal cell carcinoma ~ 2012    "MOHS on right neck"  . High cholesterol       "creeping up; not taking RX yet" (02/12/2014)  . Hepatitis 1952    "yellow jaundice"  . Headache(784.0)     "5d X wk since fall w/concussion in 06/2013" (02/12/2014)  . Anxiety     Past Surgical History  Procedure Laterality Date  . US echocardiography  2008    EF 50-55% , trivial AR,hypokinesis of mid inferior wall  . Nuclear stress test  08/2010    EF 49%, Normal  . Cardiac catheterization  2008  . Anterior cervical decomp/discectomy fusion  2010  . Appendectomy    . Shoulder arthroscopy Left ?2007    "labrium repair"  . Cataract extraction w/ intraocular lens  implant, bilateral Bilateral 1990's  . Orif clavicle fracture Left 2006    using DePuy clavicle pin.  . Clavicle hardware removal Left 2007  . Mohs surgery Right ~ 2012    "neck; basal cell"     Current Outpatient Prescriptions  Medication Sig Dispense Refill  . allopurinol (ZYLOPRIM) 100 MG tablet Take 100 mg by mouth daily.    [name] ALPRAZolam (XANAX) 0.5 MG tablet Take 1 tablet by mouth as needed for anxiety.     [name] CARTIA XT 120 MG 24 hr capsule take 1 capsule by mouth once daily 30 capsule 11  . colchicine 0.6 MG tablet Take 1 tablet (  0.6 mg total) by mouth daily. 30 tablet 1  . cyanocobalamin (,VITAMIN B-12,) 1000 MCG/ML injection Inject 100 mcg into the muscle every 30 (thirty) days.     [name] losartan (COZAAR) 25 MG tablet Take 1 tablet (25 mg total) by mouth daily. 60 tablet 6  . oxyCODONE-acetaminophen (PERCOCET/ROXICET) 5-325 MG per tablet Take 1-2 tablets by mouth every 6 (six) hours as needed for severe pain. 20 tablet 0  . pantoprazole (PROTONIX) 20 MG tablet Take 20 mg by mouth daily.    [name] zolpidem (AMBIEN) 10 MG tablet Take 1 tablet (10 mg total) by mouth at bedtime as needed for sleep. 30 tablet 5  . [DISCONTINUED] potassium chloride SA (K-DUR,KLOR-CON) 20 MEQ tablet Take 1 tablet (20 mEq total) by mouth daily. 14 tablet 0   No current facility-administered medications for this visit.    Allergies:    Amlodipine; Codeine; Lisinopril; Losartan; Micardis hct; Olmesartan; Toprol xl; and Triamterene    Social History:  The patient  reports that he has never smoked. He has never used smokeless tobacco. He reports that he drinks about 4.2 oz of alcohol per week. He reports that he does not use illicit drugs.   Family History:  The patient's family history includes Healthy in his brother and daughter; Other in his father and mother.    ROS:  Please see the history of present illness.   Otherwise, review of systems are positive for none.   All other systems are reviewed and negative.    PHYSICAL EXAM: VS:  BP 124/80 mmHg  Pulse 92  Ht 6\' 2"  (1.88 m)  Wt 198 lb 1.9 oz (89.867 kg)  BMI 25.43 kg/m2 , BMI Body mass index is 25.43 kg/(m^2). GEN: Well nourished, well developed, in no acute distress HEENT: normal Neck: no JVD, carotid bruits, or masses Cardiac: RRR; no murmurs, rubs, or gallops,no edema  Respiratory:  clear to auscultation bilaterally, normal work of breathing GI: soft, nontender, nondistended, + BS MS: no deformity or atrophy Skin: warm and dry, no rash Neuro:  Strength and sensation are intact Psych: euthymic mood, full affect   EKG:  EKG is not ordered today.    Recent Labs: 02/15/2014: ALT 35; BUN 7; Creatinine 0.74; Hemoglobin 12.9*; Platelets 149*; Potassium 3.3*; Sodium 137    Lipid Panel No results found for: CHOL, TRIG, HDL, CHOLHDL, VLDL, LDLCALC, LDLDIRECT    Wt Readings from Last 3 Encounters:  11/20/14 198 lb 1.9 oz (89.867 kg)  05/27/14 209 lb (94.802 kg)  02/15/14 216 lb 14.9 oz (98.4 kg)        ASSESSMENT AND PLAN:  1. Essential hypertension. 2. Insomnia 3. Recent acute pancreatitis.  Continue current meds. Recheck in 6 months.   Current medicines are reviewed at length with the patient today.  The patient does not have concerns regarding medicines.  The following changes have been made:  no change  Labs/ tests ordered today  include: No orders of the defined types were placed in this encounter.    Disposition: Recheck in 6 months.  Continue current medication.   Signed, Darlin Coco, MD  11/20/2014 5:49 PM    [facility] [address], [place], [place]  [address] Phone: [phone]; Fax: [phone]

## 2014-12-09 ENCOUNTER — Other Ambulatory Visit: Payer: Self-pay | Admitting: Cardiology

## 2014-12-18 ENCOUNTER — Other Ambulatory Visit: Payer: Self-pay

## 2014-12-18 MED ORDER — DILTIAZEM HCL ER COATED BEADS 120 MG PO CP24: 120.0000 mg | ORAL_CAPSULE | Freq: Every day | ORAL | Status: AC

## 2014-12-18 MED ORDER — LOSARTAN POTASSIUM 25 MG PO TABS: 25.0000 mg | ORAL_TABLET | Freq: Every day | ORAL | Status: DC

## 2014-12-22 ENCOUNTER — Telehealth: Payer: Self-pay

## 2014-12-22 NOTE — Telephone Encounter (Signed)
Spoke with Brian Villegas  Ignore the Rx has expired on script

## 2014-12-22 NOTE — Telephone Encounter (Signed)
losartan (COZAAR) 25 MG tablet 180 tablet 3 12/18/2014     Sig - Route:  Take 1 tablet (25 mg total) by mouth daily. - Oral    Class:  Normal    DAW:  No    Comment:  Rx has expired - no refills remain    Authorizing Provider:  Darlin Coco, MD    Ordering User:  Audria [email or web address]

## 2014-12-30 ENCOUNTER — Other Ambulatory Visit: Payer: Self-pay

## 2015-01-08 ENCOUNTER — Encounter (HOSPITAL_BASED_OUTPATIENT_CLINIC_OR_DEPARTMENT_OTHER): Payer: Self-pay | Admitting: Emergency Medicine

## 2015-01-08 ENCOUNTER — Emergency Department (HOSPITAL_BASED_OUTPATIENT_CLINIC_OR_DEPARTMENT_OTHER)
Admission: EM | Admit: 2015-01-08 | Discharge: 2015-01-08 | Disposition: A | Discharge status: Home / Self Care | Payer: Medicare Other | Attending: Emergency Medicine | Admitting: Emergency Medicine

## 2015-01-08 ENCOUNTER — Emergency Department (HOSPITAL_BASED_OUTPATIENT_CLINIC_OR_DEPARTMENT_OTHER): Payer: Medicare Other

## 2015-01-08 DIAGNOSIS — I10 Essential (primary) hypertension: Secondary | ICD-10-CM | POA: Diagnosis not present

## 2015-01-08 DIAGNOSIS — R079 Chest pain, unspecified: Secondary | ICD-10-CM | POA: Insufficient documentation

## 2015-01-08 DIAGNOSIS — Z85828 Personal history of other malignant neoplasm of skin: Secondary | ICD-10-CM | POA: Insufficient documentation

## 2015-01-08 DIAGNOSIS — F419 Anxiety disorder, unspecified: Secondary | ICD-10-CM | POA: Insufficient documentation

## 2015-01-08 DIAGNOSIS — Z9889 Other specified postprocedural states: Secondary | ICD-10-CM | POA: Insufficient documentation

## 2015-01-08 DIAGNOSIS — R748 Abnormal levels of other serum enzymes: Secondary | ICD-10-CM | POA: Insufficient documentation

## 2015-01-08 DIAGNOSIS — Z8639 Personal history of other endocrine, nutritional and metabolic disease: Secondary | ICD-10-CM | POA: Insufficient documentation

## 2015-01-08 DIAGNOSIS — Z862 Personal history of diseases of the blood and blood-forming organs and certain disorders involving the immune mechanism: Secondary | ICD-10-CM | POA: Insufficient documentation

## 2015-01-08 DIAGNOSIS — K861 Other chronic pancreatitis: Secondary | ICD-10-CM | POA: Insufficient documentation

## 2015-01-08 DIAGNOSIS — R10816 Epigastric abdominal tenderness: Secondary | ICD-10-CM

## 2015-01-08 LAB — URINALYSIS, ROUTINE W REFLEX MICROSCOPIC
Glucose, UA: NEGATIVE mg/dL
KETONES UR: 15 mg/dL — AB
NITRITE: NEGATIVE
PH: 6.5 (ref 5.0–8.0)
Protein, ur: NEGATIVE mg/dL
SPECIFIC GRAVITY, URINE: 1.01 (ref 1.005–1.030)
Urobilinogen, UA: 4 mg/dL — ABNORMAL HIGH (ref 0.0–1.0)

## 2015-01-08 LAB — CBC WITH DIFFERENTIAL/PLATELET
Basophils Absolute: 0 10*3/uL (ref 0.0–0.1)
Basophils Relative: 0 % (ref 0–1)
EOS PCT: 3 % (ref 0–5)
Eosinophils Absolute: 0.3 10*3/uL (ref 0.0–0.7)
HCT: 47.9 % (ref 39.0–52.0)
HEMOGLOBIN: 16.7 g/dL (ref 13.0–17.0)
LYMPHS PCT: 13 % (ref 12–46)
Lymphs Abs: 1.3 10*3/uL (ref 0.7–4.0)
MCH: 34.2 pg — ABNORMAL HIGH (ref 26.0–34.0)
MCHC: 34.9 g/dL (ref 30.0–36.0)
MCV: 98 fL (ref 78.0–100.0)
MONO ABS: 1.1 10*3/uL — AB (ref 0.1–1.0)
Monocytes Relative: 11 % (ref 3–12)
NEUTROS PCT: 73 % (ref 43–77)
Neutro Abs: 7.3 10*3/uL (ref 1.7–7.7)
Platelets: 212 10*3/uL (ref 150–400)
RBC: 4.89 MIL/uL (ref 4.22–5.81)
RDW: 13.1 % (ref 11.5–15.5)
WBC: 10 10*3/uL (ref 4.0–10.5)

## 2015-01-08 LAB — BASIC METABOLIC PANEL
Anion gap: 10 (ref 5–15)
BUN: 11 mg/dL (ref 6–20)
CHLORIDE: 99 mmol/L — AB (ref 101–111)
CO2: 26 mmol/L (ref 22–32)
CREATININE: 0.84 mg/dL (ref 0.61–1.24)
Calcium: 9 mg/dL (ref 8.9–10.3)
GFR calc Af Amer: >60 mL/min (ref >60)
GLUCOSE: 117 mg/dL — AB (ref 70–99)
POTASSIUM: 3.5 mmol/L (ref 3.5–5.1)
Sodium: 135 mmol/L (ref 135–145)

## 2015-01-08 LAB — URINE MICROSCOPIC-ADD ON

## 2015-01-08 LAB — HEPATIC FUNCTION PANEL
ALT: 42 U/L (ref 17–63)
AST: 44 U/L — ABNORMAL HIGH (ref 15–41)
Albumin: 3.6 g/dL (ref 3.5–5.0)
Alkaline Phosphatase: 163 U/L — ABNORMAL HIGH (ref 38–126)
BILIRUBIN DIRECT: 0.9 mg/dL — AB (ref 0.1–0.5)
BILIRUBIN INDIRECT: 1.1 mg/dL — AB (ref 0.3–0.9)
BILIRUBIN TOTAL: 2 mg/dL — AB (ref 0.3–1.2)
Total Protein: 6.8 g/dL (ref 6.5–8.1)

## 2015-01-08 LAB — TROPONIN I: Troponin I: <0.03 ng/mL (ref <0.031)

## 2015-01-08 LAB — LIPASE, BLOOD: LIPASE: 133 U/L — AB (ref 22–51)

## 2015-01-08 LAB — AMYLASE: AMYLASE: 102 U/L — AB (ref 28–100)

## 2015-01-08 MED ORDER — PROMETHAZINE HCL 25 MG/ML IJ SOLN
12.5000 mg | Freq: Once | INTRAMUSCULAR | Status: AC
  Administered 2015-01-08: 12.5 mg via INTRAVENOUS
  Filled 2015-01-08: qty 1

## 2015-01-08 MED ORDER — ASPIRIN 325 MG PO TABS
325.0000 mg | ORAL_TABLET | Freq: Once | ORAL | Status: AC
  Administered 2015-01-08: 325 mg via ORAL
  Filled 2015-01-08: qty 1

## 2015-01-08 MED ORDER — FENTANYL CITRATE (PF) 100 MCG/2ML IJ SOLN
50.0000 ug | Freq: Once | INTRAMUSCULAR | Status: AC
  Administered 2015-01-08: 50 ug via INTRAVENOUS
  Filled 2015-01-08: qty 2

## 2015-01-08 MED ORDER — ONDANSETRON HCL 4 MG PO TABS: 4.0000 mg | ORAL_TABLET | Freq: Four times a day (QID) | ORAL | Status: DC

## 2015-01-08 MED ORDER — SODIUM CHLORIDE 0.9 % IV BOLUS (SEPSIS)
1000.0000 mL | Freq: Once | INTRAVENOUS | Status: AC
  Administered 2015-01-08: 1000 mL via INTRAVENOUS

## 2015-01-08 MED ORDER — OXYCODONE-ACETAMINOPHEN 5-325 MG PO TABS
1.0000 | ORAL_TABLET | Freq: Four times a day (QID) | ORAL | Status: DC | PRN
Start: 2015-01-08 — End: 2015-03-18

## 2015-01-08 NOTE — Discharge Instructions (Signed)
Return to the emergency room with worsening of symptoms, new symptoms or with symptoms that are concerning , especially fevers, abdominal pain in one area, unable to keep down fluids, blood in stool or vomit, severe pain, you feel faint, lightheaded or pass out. Please call your doctor/gastroenterologist for a followup appointment within 24-48 hours. When you talk to your doctor please let them know that you were seen in the emergency department and have them acquire all of your records so that they can discuss the findings with you and formulate a treatment plan to fully care for your new and ongoing problems. If you do not have a primary care provider please call the number below under ED resources to establish care with a provider and follow up.  Percocet for severe pain. Do not operate machinery, drive or drink alcohol while taking narcotics or muscle relaxers. Zofran for nausea.  Read below information and follow recommendations. Low-Fat Diet for Pancreatitis or Gallbladder Conditions A low-fat diet can be helpful if you have pancreatitis or a gallbladder condition. With these conditions, your pancreas and gallbladder have trouble digesting fats. A healthy eating plan with less fat will help rest your pancreas and gallbladder and reduce your symptoms. WHAT DO I NEED TO KNOW ABOUT THIS DIET?  Eat a low-fat diet.  Reduce your fat intake to less than 20-30% of your total daily calories. This is less than 50-60 g of fat per day.  Remember that you need some fat in your diet. Ask your dietician what your daily goal should be.  Choose nonfat and low-fat healthy foods. Look for the words "nonfat," "low fat," or "fat free."  As a guide, look on the label and choose foods with less than 3 g of fat per serving. Eat only one serving.  Avoid alcohol.  Do not smoke. If you need help quitting, talk with your health care provider.  Eat small frequent meals instead of three large heavy meals. WHAT FOODS  CAN I EAT? Grains Include healthy grains and starches such as potatoes, wheat bread, fiber-rich cereal, and brown rice. Choose whole grain options whenever possible. In adults, whole grains should account for 45-65% of your daily calories.  Fruits and Vegetables Eat plenty of fruits and vegetables. Fresh fruits and vegetables add fiber to your diet. Meats and Other Protein Sources Eat lean meat such as chicken and pork. Trim any fat off of meat before cooking it. Eggs, fish, and beans are other sources of protein. In adults, these foods should account for 10-35% of your daily calories. Dairy Choose low-fat milk and dairy options. Dairy includes fat and protein, as well as calcium.  Fats and Oils Limit high-fat foods such as fried foods, sweets, baked goods, sugary drinks.  Other Creamy sauces and condiments, such as mayonnaise, can add extra fat. Think about whether or not you need to use them, or use smaller amounts or low fat options. WHAT FOODS ARE NOT RECOMMENDED?  High fat foods, such as:  [employer].  Ice cream.  [place] toast.  Sweet rolls.  Pizza.  Cheese bread.  Foods covered with batter, butter, creamy sauces, or cheese.  Fried foods.  Sugary drinks and desserts.  Foods that cause gas or bloating Document Released: 08/26/2013 Document Reviewed: 08/26/2013 [facility] Patient Information 2015 [place], [place]. This information is not intended to replace advice given to you by your health care provider. Make sure you discuss any questions you have with your health care provider.  Acute Pancreatitis Acute pancreatitis  is a disease in which the pancreas becomes suddenly inflamed. The pancreas is a large gland located behind your stomach. The pancreas produces enzymes that help digest food. The pancreas also releases the hormones glucagon and insulin that help regulate blood sugar. Damage to the pancreas occurs when the digestive enzymes from the pancreas are activated and  begin attacking the pancreas before being released into the intestine. Most acute attacks last a couple of days and can cause serious complications. Some people become dehydrated and develop low blood pressure. In severe cases, bleeding into the pancreas can lead to shock and can be life-threatening. The lungs, heart, and kidneys may fail. CAUSES  Pancreatitis can happen to anyone. In some cases, the cause is unknown. Most cases are caused by:  Alcohol abuse.  Gallstones. Other less common causes are:  Certain medicines.  Exposure to certain chemicals.  Infection.  Damage caused by an accident (trauma).  Abdominal surgery. SYMPTOMS   Pain in the upper abdomen that may radiate to the back.  Tenderness and swelling of the abdomen.  Nausea and vomiting. DIAGNOSIS  Your caregiver will perform a physical exam. Blood and stool tests may be done to confirm the diagnosis. Imaging tests may also be done, such as X-rays, CT scans, or an ultrasound of the abdomen. TREATMENT  Treatment usually requires a stay in the hospital. Treatment may include:  Pain medicine.  Fluid replacement through an intravenous line (IV).  Placing a tube in the stomach to remove stomach contents and control vomiting.  Not eating for 3 or 4 days. This gives your pancreas a rest, because enzymes are not being produced that can cause further damage.  Antibiotic medicines if your condition is caused by an infection.  Surgery of the pancreas or gallbladder. HOME CARE INSTRUCTIONS   Follow the diet advised by your caregiver. This may involve avoiding alcohol and decreasing the amount of fat in your diet.  Eat smaller, more frequent meals. This reduces the amount of digestive juices the pancreas produces.  Drink enough fluids to keep your urine clear or pale yellow.  Only take over-the-counter or prescription medicines as directed by your caregiver.  Avoid drinking alcohol if it caused your condition.  Do  not smoke.  Get plenty of rest.  Check your blood sugar at home as directed by your caregiver.  Keep all follow-up appointments as directed by your caregiver. SEEK MEDICAL CARE IF:   You do not recover as quickly as expected.  You develop new or worsening symptoms.  You have persistent pain, weakness, or nausea.  You recover and then have another episode of pain. SEEK IMMEDIATE MEDICAL CARE IF:   You are unable to eat or keep fluids down.  Your pain becomes severe.  You have a fever or persistent symptoms for more than 2 to 3 days.  You have a fever and your symptoms suddenly get worse.  Your skin or the white part of your eyes turn yellow (jaundice).  You develop vomiting.  You feel dizzy, or you faint.  Your blood sugar is high (over 300 mg/dL). MAKE SURE YOU:   Understand these instructions.  Will watch your condition.  Will get help right away if you are not doing well or get worse. Document Released: 08/21/2005 Document Revised: 02/20/2012 Document Reviewed: 11/30/2011 [facility] Patient Information 2015 [place], [place]. This information is not intended to replace advice given to you by your health care provider. Make sure you discuss any questions you have with your health  care provider.

## 2015-01-08 NOTE — ED Notes (Addendum)
Pt states if pain medication is ordered he does better with Fentanyl vs Dilaudid

## 2015-01-08 NOTE — ED Provider Notes (Signed)
CSN: [id number]     Arrival date & time 01/08/15  1616 History   First MD Initiated Contact with Patient 01/08/15 1718     Chief Complaint  Patient presents with  . Chest Pain  . Abdominal Pain     (Consider location/radiation/quality/duration/timing/severity/associated sxs/prior Treatment) HPI  Brian Villegas is a 69 y.o. male with PMH of hypertension, polycythemia, , acid reflux, dyslipidemia presenting with 4 days of intermittent abdominal pain described as aching. Patient endorses associated nausea without vomiting or diarrhea. Last BM yesterday and normal without blood or melanotic stool. Patient denies alleviating or aggravating factors. He reports associated chest pain for last 2 days that is described as an ache and is intermittent. They occur at rest. No alleviating or aggravating factors. Patient denies fevers or chills, cough congestion. Patient denies shortness of breath, lightheadedness, diaphoresis. Patient states he is seen by his GI doctor yesterday and was told that his potassium was high and that he is to come to the emergency room. Patient falls as gastroenterologists for problems with his pancreas. All surgeries include appendectomy.   Past Medical History  Diagnosis Date  . Hypertension     multiple medication intolerances  . Polycythemia     requires phlebotomies   . Alcohol consuption of more than two drinks per day   . Memory loss     "since fall w/concussion in 06/2013" (02/12/2014)  . Concussion with loss of consciousness 06/18/2013  . GERD (gastroesophageal reflux disease)   . Constipation   . Basal cell carcinoma ~ 2012    "MOHS on right neck"  . High cholesterol     "creeping up; not taking RX yet" (02/12/2014)  . Hepatitis 1952    "yellow jaundice"  . Headache(784.0)     "5d X wk since fall w/concussion in 06/2013" (02/12/2014)  . Anxiety    Past Surgical History  Procedure Laterality Date  . US echocardiography  2008    EF 50-55% , trivial  AR,hypokinesis of mid inferior wall  . Nuclear stress test  08/2010    EF 49%, Normal  . Cardiac catheterization  2008  . Anterior cervical decomp/discectomy fusion  2010  . Appendectomy    . Shoulder arthroscopy Left ?2007    "labrium repair"  . Cataract extraction w/ intraocular lens  implant, bilateral Bilateral 1990's  . Orif clavicle fracture Left 2006    using DePuy clavicle pin.  . Clavicle hardware removal Left 2007  . Mohs surgery Right ~ 2012    "neck; basal cell"   Family History  Problem Relation Age of Onset  . Other Mother     Died, 90 complicated from esophageal stricutre  . Other Father     Died, 43 from carbon monoxide poisoning  . Healthy Brother   . Healthy Daughter    History  Substance Use Topics  . Smoking status: Never Smoker   . Smokeless tobacco: Never Used  . Alcohol Use: 4.2 oz/week    7 Shots of liquor per week     Comment: 02/12/2014 "1 shot of vodka nightly x 20years"    Review of Systems 10 Systems reviewed and are negative for acute change except as noted in the HPI.    Allergies  Amlodipine; Codeine; Lisinopril; Losartan; Micardis hct; Olmesartan; Toprol xl; and Triamterene  Home Medications   Prior to Admission medications   Medication Sig Start Date End Date Taking? Authorizing Provider  allopurinol (ZYLOPRIM) 100 MG tablet Take 100 mg by mouth daily.  Historical Provider, MD  ALPRAZolam Duanne Moron) 0.5 MG tablet Take 1 tablet by mouth as needed for anxiety.  01/29/11   Historical Provider, MD  colchicine 0.6 MG tablet Take 1 tablet (0.6 mg total) by mouth daily. 08/31/14   Gregor Hams, MD  cyanocobalamin (,VITAMIN B-12,) 1000 MCG/ML injection Inject 100 mcg into the muscle every 30 (thirty) days.  04/16/12   Historical Provider, MD  diltiazem (CARTIA XT) 120 MG 24 hr capsule Take 1 capsule (120 mg total) by mouth daily. 12/18/14   Darlin Coco, MD  losartan (COZAAR) 25 MG tablet Take 1 tablet (25 mg total) by mouth daily. 12/18/14    Darlin Coco, MD  ondansetron (ZOFRAN) 4 MG tablet Take 1 tablet (4 mg total) by mouth every 6 (six) hours. 01/08/15   Al Corpus, PA-C  oxyCODONE-acetaminophen (PERCOCET/ROXICET) 5-325 MG per tablet Take 1-2 tablets by mouth every 6 (six) hours as needed for severe pain. 01/08/15   Al Corpus, PA-C  pantoprazole (PROTONIX) 20 MG tablet Take 20 mg by mouth daily.    Historical Provider, MD  zolpidem (AMBIEN) 10 MG tablet Take 1 tablet (10 mg total) by mouth at bedtime as needed for sleep. 05/27/14   Darlin Coco, MD   BP 149/81 mmHg  Pulse 74  Temp(Src) 98.9 F (37.2 C) (Oral)  Resp 22  Ht 6' 3"  (1.905 m)  Wt 195 lb (88.451 kg)  BMI 24.37 kg/m2  SpO2 98% Physical Exam  Constitutional: He appears well-developed and well-nourished. No distress.  HENT:  Head: Normocephalic and atraumatic.  Mouth/Throat: Oropharynx is clear and moist.  Eyes: Conjunctivae and EOM are normal. Right eye exhibits no discharge. Left eye exhibits no discharge.  Neck: No JVD present.  Cardiovascular: Normal rate and regular rhythm.   No leg swelling or tenderness. Negative Homan's sign.  Pulmonary/Chest: Effort normal and breath sounds normal. No respiratory distress. He has no wheezes.  Abdominal: Soft. Bowel sounds are normal. He exhibits no distension.  Diffuse abdominal tenderness without rebound, rigidity, guarding. No CVA tenderness.  Neurological: He is alert. He exhibits normal muscle tone. Coordination normal.  Skin: Skin is warm and dry. He is not diaphoretic.  Nursing note and vitals reviewed.   ED Course  Procedures (including critical care time) Labs Review Labs Reviewed  URINALYSIS, ROUTINE W REFLEX MICROSCOPIC - Abnormal; Notable for the following:    Color, Urine AMBER (*)    Hgb urine dipstick TRACE (*)    Bilirubin Urine MODERATE (*)    Ketones, ur 15 (*)    Urobilinogen, UA 4.0 (*)    Leukocytes, UA SMALL (*)    All other components within normal limits  AMYLASE -  Abnormal; Notable for the following:    Amylase 102 (*)    All other components within normal limits  CBC WITH DIFFERENTIAL/PLATELET - Abnormal; Notable for the following:    MCH 34.2 (*)    Monocytes Absolute 1.1 (*)    All other components within normal limits  BASIC METABOLIC PANEL - Abnormal; Notable for the following:    Chloride 99 (*)    Glucose, Bld 117 (*)    All other components within normal limits  HEPATIC FUNCTION PANEL - Abnormal; Notable for the following:    AST 44 (*)    Alkaline Phosphatase 163 (*)    Total Bilirubin 2.0 (*)    Bilirubin, Direct 0.9 (*)    Indirect Bilirubin 1.1 (*)    All other components within normal limits  LIPASE, BLOOD -  Abnormal; Notable for the following:    Lipase 133 (*)    All other components within normal limits  TROPONIN I  URINE MICROSCOPIC-ADD ON  TROPONIN I    Imaging Review Dg Abd Acute W/chest  01/08/2015   CLINICAL DATA:  Pt co abdominal pain since Tuesday. Chest pain for two days. Some sob, dizziness, nausea  EXAM: DG ABDOMEN ACUTE W/ 1V CHEST  COMPARISON:  CT, 08/14/2014  FINDINGS: Normal bowel gas pattern. No free air. No evidence of renal or ureteral stones. Soft tissues are unremarkable.  Cardiac silhouette is normal in size. No mediastinal or hilar masses. Clear lungs.  Changes from a previous anterior cervical spine fusion. Mild levoscoliosis of the lumbar spine with degenerative disc changes.  IMPRESSION: 1. No acute findings in the abdomen. No evidence of bowel obstruction, generalized adynamic ileus or free air. 2. No acute cardiopulmonary disease.   Electronically Signed   By: Lajean Manes M.D.   On: 01/08/2015 18:24     EKG Interpretation   Date/Time:  Friday Jan 08 2015 16:37:50 EDT Ventricular Rate:  117 PR Interval:  158 QRS Duration: 86 QT Interval:  328 QTC Calculation: 457 R Axis:   -59 Text Interpretation:  Sinus tachycardia Left anterior fascicular block  Inferior infarct , age undetermined  Anterolateral infarct , age  undetermined Abnormal ECG SINCE LAST TRACING HEART RATE HAS INCREASED  Confirmed by BELFI  MD, MELANIE ([id number]) on 01/08/2015 5:03:53 PM      MDM   Final diagnoses:  Epigastric abdominal tenderness  Chronic pancreatitis, unspecified pancreatitis type  Chest pain, unspecified chest pain type   Patient presenting with history of pancreatitis with intermittent epigastric abdominal pain for 4-5 days without associated nausea but no emesis. VSS. Mild epigastric abdominal tenderness without evidence of peritonitis. Patient reports significant improvement of pain medication in ED. No active vomiting. On repeat abdominal exam patient with minimal abdominal tenderness. Chest pain likely related to pancreatitis. Negative troponin and delta troponin and EKG without significant change. I doubt ACS. Labwork up significant for elevated alk phos, and total bilirubin. No RUQ tenderness, jaundice, N/V, fevers, leukocytosis. I doubt cholecystitis. Patient with mildly elevated amylase and lipase. I suspect is related to his pancreatitis. I doubt acute abdominopelvic process. Pain managed in ED and no active nausea vomiting. Patient nontoxic nonseptic and well-appearing and stable for discharge and outpatient management. Patient to call gastroenterologist Monday morning for follow-up. Zofran and oxycodone provided. Driving and sedation precautions provided. Discussed alcohol cessation.   Discussed return precautions with patient. Discussed all results and patient verbalizes understanding and agrees with plan.  This is a shared patient. This patient was discussed with the physician who saw and evaluated the patient and agrees with the plan.   Al Corpus, PA-C 01/08/15 2148  Malvin Johns, MD 01/08/15 2300

## 2015-01-08 NOTE — ED Notes (Signed)
Abdominal pain since Tuesday.  Chest pain for two days.  Some sob, dizziness, nausea, diaphoresis.  Pt seen at [facility] doctor yesterday and was told today that his potassium was high and referred to the ED.

## 2015-03-14 ENCOUNTER — Encounter (HOSPITAL_COMMUNITY): Payer: Self-pay | Admitting: *Deleted

## 2015-03-14 ENCOUNTER — Inpatient Hospital Stay (HOSPITAL_COMMUNITY)
Admission: EM | Admit: 2015-03-14 | Discharge: 2015-03-18 | DRG: 418 | Disposition: A | Discharge status: Home / Self Care | Payer: Medicare Other | Attending: Internal Medicine | Admitting: Internal Medicine

## 2015-03-14 ENCOUNTER — Emergency Department (HOSPITAL_COMMUNITY): Payer: Medicare Other

## 2015-03-14 DIAGNOSIS — Z9842 Cataract extraction status, left eye: Secondary | ICD-10-CM

## 2015-03-14 DIAGNOSIS — K760 Fatty (change of) liver, not elsewhere classified: Secondary | ICD-10-CM | POA: Diagnosis present

## 2015-03-14 DIAGNOSIS — Z961 Presence of intraocular lens: Secondary | ICD-10-CM | POA: Diagnosis present

## 2015-03-14 DIAGNOSIS — K219 Gastro-esophageal reflux disease without esophagitis: Secondary | ICD-10-CM | POA: Diagnosis present

## 2015-03-14 DIAGNOSIS — K8000 Calculus of gallbladder with acute cholecystitis without obstruction: Secondary | ICD-10-CM | POA: Diagnosis not present

## 2015-03-14 DIAGNOSIS — I10 Essential (primary) hypertension: Secondary | ICD-10-CM | POA: Diagnosis present

## 2015-03-14 DIAGNOSIS — E871 Hypo-osmolality and hyponatremia: Secondary | ICD-10-CM | POA: Diagnosis not present

## 2015-03-14 DIAGNOSIS — R1011 Right upper quadrant pain: Secondary | ICD-10-CM

## 2015-03-14 DIAGNOSIS — K861 Other chronic pancreatitis: Secondary | ICD-10-CM | POA: Diagnosis present

## 2015-03-14 DIAGNOSIS — E780 Pure hypercholesterolemia: Secondary | ICD-10-CM | POA: Diagnosis present

## 2015-03-14 DIAGNOSIS — D45 Polycythemia vera: Secondary | ICD-10-CM | POA: Diagnosis present

## 2015-03-14 DIAGNOSIS — Z888 Allergy status to other drugs, medicaments and biological substances status: Secondary | ICD-10-CM

## 2015-03-14 DIAGNOSIS — K8020 Calculus of gallbladder without cholecystitis without obstruction: Secondary | ICD-10-CM

## 2015-03-14 DIAGNOSIS — Z79891 Long term (current) use of opiate analgesic: Secondary | ICD-10-CM

## 2015-03-14 DIAGNOSIS — Z885 Allergy status to narcotic agent status: Secondary | ICD-10-CM

## 2015-03-14 DIAGNOSIS — Z9841 Cataract extraction status, right eye: Secondary | ICD-10-CM

## 2015-03-14 DIAGNOSIS — Z79899 Other long term (current) drug therapy: Secondary | ICD-10-CM

## 2015-03-14 DIAGNOSIS — F1020 Alcohol dependence, uncomplicated: Secondary | ICD-10-CM | POA: Diagnosis present

## 2015-03-14 HISTORY — DX: Acute pancreatitis without necrosis or infection, unspecified: K85.90

## 2015-03-14 LAB — URINALYSIS, DIPSTICK ONLY
Glucose, UA: NEGATIVE mg/dL
Hgb urine dipstick: NEGATIVE
Ketones, ur: 40 mg/dL — AB
NITRITE: POSITIVE — AB
Protein, ur: NEGATIVE mg/dL
SPECIFIC GRAVITY, URINE: 1.025 (ref 1.005–1.030)
Urobilinogen, UA: 2 mg/dL — ABNORMAL HIGH (ref 0.0–1.0)
pH: 7.5 (ref 5.0–8.0)

## 2015-03-14 LAB — CBC WITH DIFFERENTIAL/PLATELET
BASOS PCT: 0 % (ref 0–1)
Basophils Absolute: 0 10*3/uL (ref 0.0–0.1)
Eosinophils Absolute: 0.2 10*3/uL (ref 0.0–0.7)
Eosinophils Relative: 2 % (ref 0–5)
HCT: 46.4 % (ref 39.0–52.0)
Hemoglobin: 16.1 g/dL (ref 13.0–17.0)
Lymphocytes Relative: 33 % (ref 12–46)
Lymphs Abs: 2.3 10*3/uL (ref 0.7–4.0)
MCH: 35.3 pg — ABNORMAL HIGH (ref 26.0–34.0)
MCHC: 34.7 g/dL (ref 30.0–36.0)
MCV: 101.8 fL — AB (ref 78.0–100.0)
MONO ABS: 0.7 10*3/uL (ref 0.1–1.0)
MONOS PCT: 10 % (ref 3–12)
NEUTROS PCT: 55 % (ref 43–77)
Neutro Abs: 3.9 10*3/uL (ref 1.7–7.7)
Platelets: 268 10*3/uL (ref 150–400)
RBC: 4.56 MIL/uL (ref 4.22–5.81)
RDW: 17 % — AB (ref 11.5–15.5)
WBC: 7.2 10*3/uL (ref 4.0–10.5)

## 2015-03-14 LAB — COMPREHENSIVE METABOLIC PANEL
ALBUMIN: 3.8 g/dL (ref 3.5–5.0)
ALT: 66 U/L — ABNORMAL HIGH (ref 17–63)
ANION GAP: 12 (ref 5–15)
AST: 92 U/L — AB (ref 15–41)
Alkaline Phosphatase: 103 U/L (ref 38–126)
BILIRUBIN TOTAL: 2.3 mg/dL — AB (ref 0.3–1.2)
BUN: 9 mg/dL (ref 6–20)
CO2: 24 mmol/L (ref 22–32)
CREATININE: 1.05 mg/dL (ref 0.61–1.24)
Calcium: 9.8 mg/dL (ref 8.9–10.3)
Chloride: 100 mmol/L — ABNORMAL LOW (ref 101–111)
GFR calc non Af Amer: >60 mL/min (ref >60)
Glucose, Bld: 128 mg/dL — ABNORMAL HIGH (ref 65–99)
Potassium: 3.7 mmol/L (ref 3.5–5.1)
Sodium: 136 mmol/L (ref 135–145)
TOTAL PROTEIN: 7.1 g/dL (ref 6.5–8.1)

## 2015-03-14 LAB — LIPASE, BLOOD: LIPASE: 16 U/L — AB (ref 22–51)

## 2015-03-14 LAB — I-STAT TROPONIN, ED: Troponin i, poc: 0 ng/mL (ref 0.00–0.08)

## 2015-03-14 LAB — TROPONIN I: Troponin I: <0.03 ng/mL (ref <0.031)

## 2015-03-14 MED ORDER — FENTANYL CITRATE (PF) 100 MCG/2ML IJ SOLN
INTRAMUSCULAR | Status: AC
  Filled 2015-03-14: qty 2

## 2015-03-14 MED ORDER — FENTANYL CITRATE (PF) 100 MCG/2ML IJ SOLN
100.0000 ug | Freq: Once | INTRAMUSCULAR | Status: AC
  Administered 2015-03-14: 100 ug via INTRAVENOUS
  Filled 2015-03-14: qty 2

## 2015-03-14 MED ORDER — ONDANSETRON HCL 4 MG/2ML IJ SOLN
4.0000 mg | Freq: Once | INTRAMUSCULAR | Status: AC
Start: 2015-03-14 — End: 2015-03-14
  Administered 2015-03-14: 4 mg via INTRAVENOUS
  Filled 2015-03-14: qty 2

## 2015-03-14 MED ORDER — FENTANYL CITRATE (PF) 100 MCG/2ML IJ SOLN
50.0000 ug | Freq: Once | INTRAMUSCULAR | Status: AC
  Administered 2015-03-14: 50 ug via NASAL

## 2015-03-14 MED ORDER — ONDANSETRON HCL 4 MG/2ML IJ SOLN
4.0000 mg | Freq: Once | INTRAMUSCULAR | Status: AC
  Administered 2015-03-14: 4 mg via INTRAVENOUS
  Filled 2015-03-14: qty 2

## 2015-03-14 MED ORDER — OXYCODONE-ACETAMINOPHEN 5-325 MG PO TABS: 1.0000 | ORAL_TABLET | Freq: Once | ORAL | Status: DC

## 2015-03-14 MED ORDER — MORPHINE SULFATE 4 MG/ML IJ SOLN
8.0000 mg | Freq: Once | INTRAMUSCULAR | Status: AC
  Administered 2015-03-14: 8 mg via INTRAVENOUS
  Filled 2015-03-14: qty 2

## 2015-03-14 NOTE — ED Provider Notes (Signed)
CSN: [id number]     Arrival date & time 03/14/15  1612 History   First MD Initiated Contact with Patient 03/14/15 1830     Chief Complaint  Patient presents with  . Abdominal Pain   HPI Patient is a 69 year old male with a history of hypertension and excessive alcohol use with episodes of pancreatitis presenting today for abdominal pain. Abdominal pain has been increasing over the past week sharp in nature radiating to his back. Progress of the point where he decided come to the emergency department today. While in the emergency department had 2 episodes of nonbloody nonbilious emesis. Reports that pain is progressively worsening while in the ED. Denies any frank chest pain or shortness of breath with this.  Reports not similar to previous episodes of pancreatitis.    Past Medical History  Diagnosis Date  . Hypertension     multiple medication intolerances  . Polycythemia     requires phlebotomies   . Alcohol consuption of more than two drinks per day   . Memory loss     "since fall w/concussion in 06/2013" (02/12/2014)  . Concussion with loss of consciousness 06/18/2013  . GERD (gastroesophageal reflux disease)   . Constipation   . Basal cell carcinoma ~ 2012    "MOHS on right neck"  . High cholesterol     "creeping up; not taking RX yet" (02/12/2014)  . Hepatitis 1952    "yellow jaundice"  . Headache(784.0)     "5d X wk since fall w/concussion in 06/2013" (02/12/2014)  . Anxiety   . Pancreatitis    Past Surgical History  Procedure Laterality Date  . US echocardiography  2008    EF 50-55% , trivial AR,hypokinesis of mid inferior wall  . Nuclear stress test  08/2010    EF 49%, Normal  . Cardiac catheterization  2008  . Anterior cervical decomp/discectomy fusion  2010  . Appendectomy    . Shoulder arthroscopy Left ?2007    "labrium repair"  . Cataract extraction w/ intraocular lens  implant, bilateral Bilateral 1990's  . Orif clavicle fracture Left 2006    using DePuy  clavicle pin.  . Clavicle hardware removal Left 2007  . Mohs surgery Right ~ 2012    "neck; basal cell"   Family History  Problem Relation Age of Onset  . Other Mother     Died, 90 complicated from esophageal stricutre  . Other Father     Died, 77 from carbon monoxide poisoning  . Healthy Brother   . Healthy Daughter    History  Substance Use Topics  . Smoking status: Never Smoker   . Smokeless tobacco: Never Used  . Alcohol Use: 4.2 oz/week    7 Shots of liquor per week     Comment: 02/12/2014 "1 shot of vodka nightly x 20years"    Review of Systems  Constitutional: Negative for fever and chills.  HENT: Negative for congestion and sore throat.   Eyes: Negative for pain.  Respiratory: Negative for cough and shortness of breath.   Cardiovascular: Negative for chest pain and palpitations.  Gastrointestinal: Positive for nausea, vomiting and abdominal pain. Negative for diarrhea and blood in stool.  Endocrine: Negative.   Genitourinary: Negative for flank pain.  Musculoskeletal: Negative for back pain and neck pain.  Skin: Negative for rash.  Allergic/Immunologic: Negative.   Neurological: Negative for dizziness, syncope and light-headedness.  Psychiatric/Behavioral: Negative for confusion.   Allergies  Amlodipine; Codeine; Lisinopril; Micardis hct; Olmesartan; Toprol xl; and Triamterene  Home Medications   Prior to Admission medications   Medication Sig Start Date End Date Taking? Authorizing Provider  allopurinol (ZYLOPRIM) 100 MG tablet Take 100 mg by mouth daily.   Yes Historical Provider, MD  diltiazem (CARTIA XT) 120 MG 24 hr capsule Take 1 capsule (120 mg total) by mouth daily. 12/18/14  Yes Darlin Coco, MD  losartan (COZAAR) 25 MG tablet Take 1 tablet (25 mg total) by mouth daily. 12/18/14  Yes Darlin Coco, MD  ondansetron (ZOFRAN) 4 MG tablet Take 1 tablet (4 mg total) by mouth every 6 (six) hours. Patient taking differently: Take 4 mg by mouth every 6  (six) hours as needed for nausea.  01/08/15  Yes Al Corpus, PA-C  pantoprazole (PROTONIX) 40 MG tablet Take 40 mg by mouth daily.  12/22/14  Yes Historical Provider, MD  zolpidem (AMBIEN) 10 MG tablet Take 1 tablet (10 mg total) by mouth at bedtime as needed for sleep. Patient taking differently: Take 10 mg by mouth at bedtime.  05/27/14  Yes Darlin Coco, MD  oxyCODONE-acetaminophen (PERCOCET/ROXICET) 5-325 MG per tablet Take 1-2 tablets by mouth every 6 (six) hours as needed for severe pain. Patient not taking: Reported on 03/15/2015 01/08/15   Al Corpus, PA-C   BP 155/82 mmHg  Pulse 95  Temp(Src) 98.4 F (36.9 C) (Oral)  Resp 18  Ht 6\' 2"  (1.88 m)  Wt 185 lb 6.5 oz (84.1 kg)  BMI 23.79 kg/m2  SpO2 96% Physical Exam  Constitutional: He is oriented to person, place, and time. He appears well-developed and well-nourished.  HENT:  Head: Normocephalic and atraumatic.  Eyes: Conjunctivae and EOM are normal. Pupils are equal, round, and reactive to light.  Neck: Normal range of motion. Neck supple.  Cardiovascular: Normal rate, regular rhythm, normal heart sounds and intact distal pulses.   Pulmonary/Chest: Effort normal and breath sounds normal. No respiratory distress.  Abdominal: Soft. Bowel sounds are normal. There is tenderness in the right upper quadrant. There is positive Murphy's sign. There is no rigidity, no rebound, no guarding, no CVA tenderness and no tenderness at McBurney's point.  Musculoskeletal: Normal range of motion.  Neurological: He is alert and oriented to person, place, and time. He has normal reflexes. No cranial nerve deficit.  Skin: Skin is warm and dry.    ED Course  Procedures  Labs Review Labs Reviewed  CBC WITH DIFFERENTIAL/PLATELET - Abnormal; Notable for the following:    MCV 101.8 (*)    MCH 35.3 (*)    RDW 17.0 (*)    All other components within normal limits  COMPREHENSIVE METABOLIC PANEL - Abnormal; Notable for the following:     Chloride 100 (*)    Glucose, Bld 128 (*)    AST 92 (*)    ALT 66 (*)    Total Bilirubin 2.3 (*)    All other components within normal limits  LIPASE, BLOOD - Abnormal; Notable for the following:    Lipase 16 (*)    All other components within normal limits  URINALYSIS, DIPSTICK ONLY - Abnormal; Notable for the following:    Bilirubin Urine MODERATE (*)    Ketones, ur 40 (*)    Urobilinogen, UA 2.0 (*)    Nitrite POSITIVE (*)    Leukocytes, UA SMALL (*)    All other components within normal limits  MAGNESIUM - Abnormal; Notable for the following:    Magnesium 1.6 (*)    All other components within normal limits  COMPREHENSIVE METABOLIC PANEL -  Abnormal; Notable for the following:    Glucose, Bld 141 (*)    AST 87 (*)    Total Bilirubin 3.3 (*)    All other components within normal limits  CBC - Abnormal; Notable for the following:    WBC 13.3 (*)    MCV 100.5 (*)    MCH 34.9 (*)    RDW 16.9 (*)    All other components within normal limits  TROPONIN I  LACTIC ACID, PLASMA  PHOSPHORUS  TSH  CBC  I-STAT TROPOININ, ED   EMERGENCY DEPARTMENT [place] ABD/AORTA EXAM Study: Limited Ultrasound of the Abdominal Aorta.  INDICATIONS:Abdominal pain, Back pain and Age>55 Indication: Multiple views of the abdominal aorta are obtained from the diaphragmatic hiatus to the aortic bifurcation in transverse and sagittal planes with a multi- Frequency probe.  PERFORMED BY: Myself  IMAGES ARCHIVED?: Yes  FINDINGS: Maximum aortic dimensions are 2.26 x 1.7 cm  LIMITATIONS:  Bowel gas and Abdominal pain  INTERPRETATION:  Abdominal free fluid absent.     COMMENT:  Enlarging abdominal diameter as traveling distally.  No dialation > 3cm however.     Imaging Review Ct Abdomen Pelvis W Contrast  03/15/2015   CLINICAL DATA:  Right upper quadrant pain and vomiting. History of alcohol abuse and pancreatitis.  EXAM: CT ABDOMEN AND PELVIS WITH CONTRAST  TECHNIQUE: Multidetector CT imaging of the  abdomen and pelvis was performed using the standard protocol following bolus administration of intravenous contrast.  CONTRAST:  17mL OMNIPAQUE IOHEXOL 300 MG/ML  SOLN  COMPARISON:  Ultrasound abdomen 03/14/2015. CT abdomen and pelvis 08/14/2014.  FINDINGS: Atelectasis in the lung bases.  Small esophageal hiatal hernia.  Diffuse fatty infiltration of the liver. Gallbladder is mildly distended with mild wall thickening. Cholelithiasis. No bile duct dilatation. Pancreas, spleen, adrenal glands, abdominal aorta, inferior vena cava, and retroperitoneal lymph nodes are unremarkable. Low-attenuation lesions in the kidneys likely representing cysts. Largest on the right is 2.5 cm in diameter. No solid mass or hydronephrosis in either kidney. The stomach, small bowel, and colon are not abnormally distended. No free air or free fluid in the abdomen.  Pelvis: Prostate gland is not enlarged. Bladder wall is not thickened. No free or loculated pelvic fluid collections. No pelvic mass or lymphadenopathy. Appendix is surgically absent. No evidence of diverticulitis. Degenerative changes in the spine. No destructive bone lesions. Old right rib fractures.  IMPRESSION: Diffuse fatty infiltration of the liver. Gallbladder is mildly distended with suggestion of mild wall thickening. Cholelithiasis. No pericholecystic infiltration but cholecystitis is not excluded. Pancreas is normal.   Electronically Signed   By: Lucienne Capers M.D.   On: 03/15/2015 03:37   US Abdomen Limited Ruq  03/14/2015   CLINICAL DATA:  Right upper quadrant pain. Prior appendectomy. Hypertension.  EXAM: US ABDOMEN LIMITED - RIGHT UPPER QUADRANT  COMPARISON:  CT abdomen and pelvis 08/14/2014. Ultrasound abdomen 02/12/2014.  FINDINGS: Gallbladder:  Sludge and stones are demonstrated in the dependent portion of the gallbladder. No gallbladder wall thickening. Murphy's sign is negative.  Common bile duct:  Diameter: 5.9 mm, normal  Liver:  Diffusely increased  hepatic parenchymal echotexture are most likely to represent fatty infiltration. No discrete focal lesions identified.  IMPRESSION: Cholelithiasis and sludge in the gallbladder. Diffuse fatty infiltration of the liver.   Electronically Signed   By: Lucienne Capers M.D.   On: 03/14/2015 21:56     EKG Interpretation   Date/Time:  Sunday March 14 2015 16:26:44 EDT Ventricular Rate:  75 PR  Interval:  156 QRS Duration: 88 QT Interval:  392 QTC Calculation: 437 R Axis:   -21 Text Interpretation:  Normal sinus rhythm Inferior infarct , age  undetermined Anterolateral infarct , age undetermined Abnormal ECG No  significant change since last tracing Confirmed by POLLINA  MD,  CHRISTOPHER [id number]) on 03/14/2015 9:58:31 PM      MDM   Final diagnoses:  RUQ pain   Patient is a 69 year old male with a history of hypertension and excessive alcohol use with episodes of pancreatitis presenting today for abdominal pain.  On initial evaluation patient hemodynamically stable and in moderate distress. EKG with no acute ischemic changes from previous or arrhythmia.  Troponin negative.  No anemia or leukocytosis.  Given fluids and pain medicine at bedside. Abdominal exam revealed positive Murphy sign. Continued nausea and vomiting in the emergency department status post Zofran and pain medications. Bedside ultrasound revealed cholelithiasis and sludging. Pain radiating to back.  Bedside AAA [place] displaying no free fluid or frank aneurysm.  Diameter of distal aorta greater then proximal however which is abnormal.  Discussed with Dr. Roel Cluck and recommended f/u UA.  No pericolic fluid or thickened gallbladder wall seen. Formal right upper quadrant ultrasound confirming no cholecystitis. Patient's pain continued however and discuss with Dr. Hulen Skains. Surgery recommended admission with HIDA scan in the morning.   Internal medicine Dr. Roel Cluck evaluated at bedside and admitted for further tx and evaluation of symptomatic  cholelithiasis.   If performed, labs, EKGs, and imaging were reviewed/interpreted by myself and my attending and incorporated into medical decision making.  Discussed pertinent finding with patient or caregiver prior to admission with no further questions.  Pt care supervised by my attending Dr. Marja Kays, MD PGY-2  Emergency Medicine       Geronimo Boot, MD 03/15/15 Roberts, MD 03/17/15 [phone]

## 2015-03-14 NOTE — ED Notes (Signed)
Pt appears in severe pain while waiting, changed pt acuity to 2, gave pt fentanyl nasally

## 2015-03-14 NOTE — ED Notes (Signed)
Resident at bedside.  

## 2015-03-14 NOTE — ED Provider Notes (Signed)
Patient presented to the ER with abdominal and lower chest pain. Pain radiates through into his back.  Face to face Exam: HEENT - PERRLA Lungs - CTAB Heart - RRR, no M/R/G Abd - S/ND, tender epigastric and right upper quadrant, no obvious Murphy sign Neuro - alert, oriented x3  Plan: Workup consistent with gallstone with no evidence of acute cholecystitis. Will attempt analgesia, discharged with surgery follow-up if improved, if cannot get patient pain-free tonight, consult general surgery.  Orpah Greek, MD 03/14/15 2249

## 2015-03-14 NOTE — ED Notes (Signed)
Pt reports only able to take fentanyl for pain

## 2015-03-14 NOTE — ED Notes (Signed)
Pt reports mid upper abd pain for over a week, radiates through to his back. Hx of pancreatitis. Denies n/v/d.

## 2015-03-14 NOTE — ED Notes (Signed)
Patient transported to Ultrasound 

## 2015-03-15 ENCOUNTER — Inpatient Hospital Stay (HOSPITAL_COMMUNITY): Payer: Medicare Other

## 2015-03-15 ENCOUNTER — Encounter (HOSPITAL_COMMUNITY): Payer: Self-pay | Admitting: Internal Medicine

## 2015-03-15 DIAGNOSIS — F1020 Alcohol dependence, uncomplicated: Secondary | ICD-10-CM | POA: Diagnosis not present

## 2015-03-15 DIAGNOSIS — I10 Essential (primary) hypertension: Secondary | ICD-10-CM | POA: Diagnosis not present

## 2015-03-15 DIAGNOSIS — K861 Other chronic pancreatitis: Secondary | ICD-10-CM | POA: Diagnosis present

## 2015-03-15 DIAGNOSIS — Z885 Allergy status to narcotic agent status: Secondary | ICD-10-CM | POA: Diagnosis not present

## 2015-03-15 DIAGNOSIS — E871 Hypo-osmolality and hyponatremia: Secondary | ICD-10-CM | POA: Diagnosis not present

## 2015-03-15 DIAGNOSIS — Z9842 Cataract extraction status, left eye: Secondary | ICD-10-CM | POA: Diagnosis not present

## 2015-03-15 DIAGNOSIS — Z888 Allergy status to other drugs, medicaments and biological substances status: Secondary | ICD-10-CM | POA: Diagnosis not present

## 2015-03-15 DIAGNOSIS — R1011 Right upper quadrant pain: Secondary | ICD-10-CM | POA: Diagnosis present

## 2015-03-15 DIAGNOSIS — K219 Gastro-esophageal reflux disease without esophagitis: Secondary | ICD-10-CM | POA: Diagnosis present

## 2015-03-15 DIAGNOSIS — K8000 Calculus of gallbladder with acute cholecystitis without obstruction: Secondary | ICD-10-CM | POA: Diagnosis present

## 2015-03-15 DIAGNOSIS — Z79899 Other long term (current) drug therapy: Secondary | ICD-10-CM | POA: Diagnosis not present

## 2015-03-15 DIAGNOSIS — E780 Pure hypercholesterolemia: Secondary | ICD-10-CM | POA: Diagnosis present

## 2015-03-15 DIAGNOSIS — Z9841 Cataract extraction status, right eye: Secondary | ICD-10-CM | POA: Diagnosis not present

## 2015-03-15 DIAGNOSIS — Z961 Presence of intraocular lens: Secondary | ICD-10-CM | POA: Diagnosis present

## 2015-03-15 DIAGNOSIS — I159 Secondary hypertension, unspecified: Secondary | ICD-10-CM

## 2015-03-15 DIAGNOSIS — K810 Acute cholecystitis: Secondary | ICD-10-CM | POA: Diagnosis not present

## 2015-03-15 DIAGNOSIS — Z79891 Long term (current) use of opiate analgesic: Secondary | ICD-10-CM | POA: Diagnosis not present

## 2015-03-15 DIAGNOSIS — D45 Polycythemia vera: Secondary | ICD-10-CM | POA: Diagnosis present

## 2015-03-15 DIAGNOSIS — K760 Fatty (change of) liver, not elsewhere classified: Secondary | ICD-10-CM | POA: Diagnosis present

## 2015-03-15 LAB — COMPREHENSIVE METABOLIC PANEL
ALK PHOS: 92 U/L (ref 38–126)
ALT: 63 U/L (ref 17–63)
AST: 87 U/L — ABNORMAL HIGH (ref 15–41)
Albumin: 3.7 g/dL (ref 3.5–5.0)
Anion gap: 10 (ref 5–15)
BILIRUBIN TOTAL: 3.3 mg/dL — AB (ref 0.3–1.2)
BUN: 9 mg/dL (ref 6–20)
CO2: 25 mmol/L (ref 22–32)
CREATININE: 1.01 mg/dL (ref 0.61–1.24)
Calcium: 9.2 mg/dL (ref 8.9–10.3)
Chloride: 101 mmol/L (ref 101–111)
GFR calc non Af Amer: >60 mL/min (ref >60)
Glucose, Bld: 141 mg/dL — ABNORMAL HIGH (ref 65–99)
Potassium: 3.5 mmol/L (ref 3.5–5.1)
SODIUM: 136 mmol/L (ref 135–145)
TOTAL PROTEIN: 6.9 g/dL (ref 6.5–8.1)

## 2015-03-15 LAB — CBC
HEMATOCRIT: 44.6 % (ref 39.0–52.0)
Hemoglobin: 15.5 g/dL (ref 13.0–17.0)
MCH: 34.9 pg — AB (ref 26.0–34.0)
MCHC: 34.8 g/dL (ref 30.0–36.0)
MCV: 100.5 fL — ABNORMAL HIGH (ref 78.0–100.0)
PLATELETS: 253 10*3/uL (ref 150–400)
RBC: 4.44 MIL/uL (ref 4.22–5.81)
RDW: 16.9 % — AB (ref 11.5–15.5)
WBC: 13.3 10*3/uL — AB (ref 4.0–10.5)

## 2015-03-15 LAB — LACTIC ACID, PLASMA: Lactic Acid, Venous: 1.6 mmol/L (ref 0.5–2.0)

## 2015-03-15 LAB — TSH: TSH: 1.418 u[IU]/mL (ref 0.350–4.500)

## 2015-03-15 LAB — PHOSPHORUS: Phosphorus: 3 mg/dL (ref 2.5–4.6)

## 2015-03-15 LAB — MAGNESIUM: Magnesium: 1.6 mg/dL — ABNORMAL LOW (ref 1.7–2.4)

## 2015-03-15 MED ORDER — LORAZEPAM 2 MG/ML IJ SOLN
1.0000 mg | Freq: Four times a day (QID) | INTRAMUSCULAR | Status: AC | PRN
  Administered 2015-03-17: 1 mg via INTRAVENOUS
  Filled 2015-03-15: qty 1

## 2015-03-15 MED ORDER — GADOBENATE DIMEGLUMINE 529 MG/ML IV SOLN: 17.0000 mL | Freq: Once | INTRAVENOUS | Status: AC | PRN

## 2015-03-15 MED ORDER — DEXTROSE-NACL 5-0.9 % IV SOLN
INTRAVENOUS | Status: DC
  Administered 2015-03-15 – 2015-03-17 (×3): via INTRAVENOUS

## 2015-03-15 MED ORDER — DILTIAZEM HCL ER COATED BEADS 120 MG PO CP24
120.0000 mg | ORAL_CAPSULE | Freq: Every day | ORAL | Status: DC
  Administered 2015-03-15 – 2015-03-17 (×3): 120 mg via ORAL
  Filled 2015-03-15 (×5): qty 1

## 2015-03-15 MED ORDER — LOSARTAN POTASSIUM 50 MG PO TABS
25.0000 mg | ORAL_TABLET | Freq: Every day | ORAL | Status: DC
  Administered 2015-03-15: 25 mg via ORAL
  Filled 2015-03-15: qty 1

## 2015-03-15 MED ORDER — MORPHINE SULFATE 4 MG/ML IJ SOLN: 4.0000 mg | INTRAMUSCULAR | Status: DC | PRN

## 2015-03-15 MED ORDER — IOHEXOL 300 MG/ML  SOLN
100.0000 mL | Freq: Once | INTRAMUSCULAR | Status: AC | PRN
  Administered 2015-03-15: 100 mL via INTRAVENOUS

## 2015-03-15 MED ORDER — PIPERACILLIN-TAZOBACTAM 3.375 G IVPB
3.3750 g | Freq: Three times a day (TID) | INTRAVENOUS | Status: DC
  Administered 2015-03-15 – 2015-03-17 (×6): 3.375 g via INTRAVENOUS
  Filled 2015-03-15 (×9): qty 50

## 2015-03-15 MED ORDER — HYDROMORPHONE HCL 1 MG/ML IJ SOLN
1.0000 mg | INTRAMUSCULAR | Status: DC | PRN
  Filled 2015-03-15: qty 1

## 2015-03-15 MED ORDER — MORPHINE SULFATE 2 MG/ML IJ SOLN
2.0000 mg | Freq: Once | INTRAMUSCULAR | Status: AC
  Administered 2015-03-15: 2 mg via INTRAVENOUS
  Filled 2015-03-15: qty 1

## 2015-03-15 MED ORDER — ONDANSETRON HCL 4 MG/2ML IJ SOLN
4.0000 mg | Freq: Once | INTRAMUSCULAR | Status: AC
  Administered 2015-03-15: 4 mg via INTRAVENOUS
  Filled 2015-03-15: qty 2

## 2015-03-15 MED ORDER — MORPHINE SULFATE 2 MG/ML IJ SOLN
2.0000 mg | INTRAMUSCULAR | Status: DC | PRN
  Administered 2015-03-15 – 2015-03-16 (×5): 2 mg via INTRAVENOUS
  Filled 2015-03-15 (×5): qty 1

## 2015-03-15 MED ORDER — ONDANSETRON HCL 4 MG/2ML IJ SOLN
4.0000 mg | Freq: Four times a day (QID) | INTRAMUSCULAR | Status: DC | PRN
  Administered 2015-03-15 – 2015-03-17 (×7): 4 mg via INTRAVENOUS
  Filled 2015-03-15 (×7): qty 2

## 2015-03-15 MED ORDER — ALLOPURINOL 100 MG PO TABS
100.0000 mg | ORAL_TABLET | Freq: Every day | ORAL | Status: DC
  Administered 2015-03-15 – 2015-03-17 (×2): 100 mg via ORAL
  Filled 2015-03-15 (×2): qty 1

## 2015-03-15 MED ORDER — ZOLPIDEM TARTRATE 5 MG PO TABS
5.0000 mg | ORAL_TABLET | Freq: Every day | ORAL | Status: DC
  Administered 2015-03-15 – 2015-03-17 (×3): 5 mg via ORAL
  Filled 2015-03-15 (×3): qty 1

## 2015-03-15 MED ORDER — ACETAMINOPHEN 325 MG PO TABS: 650.0000 mg | ORAL_TABLET | Freq: Four times a day (QID) | ORAL | Status: DC | PRN

## 2015-03-15 MED ORDER — VITAMIN B-1 100 MG PO TABS
100.0000 mg | ORAL_TABLET | Freq: Every day | ORAL | Status: DC
  Administered 2015-03-17: 100 mg via ORAL
  Filled 2015-03-15: qty 1

## 2015-03-15 MED ORDER — SODIUM CHLORIDE 0.9 % IV SOLN
INTRAVENOUS | Status: DC
  Administered 2015-03-15: 04:00:00 via INTRAVENOUS

## 2015-03-15 MED ORDER — MAGNESIUM SULFATE 2 GM/50ML IV SOLN
2.0000 g | Freq: Once | INTRAVENOUS | Status: AC
  Administered 2015-03-15: 2 g via INTRAVENOUS
  Filled 2015-03-15: qty 50

## 2015-03-15 MED ORDER — ONDANSETRON HCL 4 MG PO TABS
4.0000 mg | ORAL_TABLET | Freq: Four times a day (QID) | ORAL | Status: DC | PRN
  Administered 2015-03-17: 4 mg via ORAL
  Filled 2015-03-15: qty 1

## 2015-03-15 MED ORDER — FOLIC ACID 1 MG PO TABS
1.0000 mg | ORAL_TABLET | Freq: Every day | ORAL | Status: DC
  Administered 2015-03-17: 1 mg via ORAL
  Filled 2015-03-15: qty 1

## 2015-03-15 MED ORDER — ACETAMINOPHEN 650 MG RE SUPP: 650.0000 mg | Freq: Four times a day (QID) | RECTAL | Status: DC | PRN

## 2015-03-15 MED ORDER — PANTOPRAZOLE SODIUM 40 MG IV SOLR
40.0000 mg | Freq: Every day | INTRAVENOUS | Status: DC
  Administered 2015-03-15 – 2015-03-16 (×3): 40 mg via INTRAVENOUS
  Filled 2015-03-15 (×3): qty 40

## 2015-03-15 MED ORDER — THIAMINE HCL 100 MG/ML IJ SOLN: 100.0000 mg | Freq: Every day | INTRAMUSCULAR | Status: DC

## 2015-03-15 MED ORDER — LORAZEPAM 1 MG PO TABS: 1.0000 mg | ORAL_TABLET | Freq: Four times a day (QID) | ORAL | Status: AC | PRN

## 2015-03-15 MED ORDER — SODIUM CHLORIDE 0.9 % IJ SOLN
3.0000 mL | Freq: Two times a day (BID) | INTRAMUSCULAR | Status: DC
  Administered 2015-03-15 – 2015-03-17 (×2): 3 mL via INTRAVENOUS

## 2015-03-15 MED ORDER — IOHEXOL 300 MG/ML  SOLN
25.0000 mL | Freq: Once | INTRAMUSCULAR | Status: AC | PRN
  Administered 2015-03-15: 25 mL via ORAL

## 2015-03-15 MED ORDER — ADULT MULTIVITAMIN W/MINERALS CH
1.0000 | ORAL_TABLET | Freq: Every day | ORAL | Status: DC
  Administered 2015-03-17: 1 via ORAL
  Filled 2015-03-15: qty 1

## 2015-03-15 MED ORDER — MORPHINE SULFATE 2 MG/ML IJ SOLN
1.0000 mg | INTRAMUSCULAR | Status: DC | PRN
  Administered 2015-03-15: 2 mg via INTRAVENOUS
  Filled 2015-03-15: qty 1

## 2015-03-15 NOTE — H&P (Signed)
PCP: Pcp Not In System    Referring provider Riester   Chief Complaint: right upper quadrant abdominal pain   HPI: Brian Villegas is a 69 y.o. male   has a past medical history of Hypertension; Polycythemia; Alcohol consuption of more than two drinks per day; Memory loss; Concussion with loss of consciousness (06/18/2013); GERD (gastroesophageal reflux disease); Constipation; Basal cell carcinoma (~ 2012); High cholesterol; Hepatitis (1952); Headache(784.0); Anxiety; and Pancreatitis.   Presented with 1 week history of right upper quadrant pain  radiating to the back. While waiting in ER he developed nausea and vomiting getting worse abdominal pain. He denies light colored  stools. No fever.  He has history of chronic pancreatitis due to hx of  alcohol abuse but states this is very different pain. Denies any  Diarrhea. In emergency department noted to have elevated LFTs AST 92 ALT 66 total bilirubin up to 2.3 lipase 16. [place] of RUQ done Which showed gall stones and sludge but no evidence of cholecystitis also evidence of diffuse fatty infiltration of the liver. US performed by ER MD showed possible mild AAA without dissection.   He have provided discussed case with general surgery Dr. Hulen Skains commands admission to medicine with HIDA scan in the morning and surgical consult.  Patient reports hx of alcohol abuse but quit 1.5 weeks ago, states if he had a bottle he would drink the whole thing to numb the pain.    Hospitalist was called for admission for right upper quadrant pain rule out cholecystitis  Review of Systems:    Pertinent positives include:   Constitutional:  No weight loss, night sweats, Fevers, chills, fatigue, weight loss  HEENT:  No headaches, Difficulty swallowing,Tooth/dental problems,Sore throat,  No sneezing, itching, ear ache, nasal congestion, post nasal drip,  Cardio-vascular:  No chest pain, Orthopnea, PND, anasarca, dizziness, palpitations.no Bilateral lower  extremity swelling  GI:  No heartburn, indigestion, abdominal pain, nausea, vomiting, diarrhea, change in bowel habits, loss of appetite, melena, blood in stool, hematemesis Resp:  no shortness of breath at rest. No dyspnea on exertion, No excess mucus, no productive cough, No non-productive cough, No coughing up of blood.No change in color of mucus.No wheezing. Skin:  no rash or lesions. No jaundice GU:  no dysuria, change in color of urine, no urgency or frequency. No straining to urinate.  No flank pain.  Musculoskeletal:  No joint pain or no joint swelling. No decreased range of motion. No back pain.  Psych:  No change in mood or affect. No depression or anxiety. No memory loss.  Neuro: no localizing neurological complaints, no tingling, no weakness, no double vision, no gait abnormality, no slurred speech, no confusion  Otherwise ROS are negative except for above, 10 systems were reviewed  Past Medical History: Past Medical History  Diagnosis Date  . Hypertension     multiple medication intolerances  . Polycythemia     requires phlebotomies   . Alcohol consuption of more than two drinks per day   . Memory loss     "since fall w/concussion in 06/2013" (02/12/2014)  . Concussion with loss of consciousness 06/18/2013  . GERD (gastroesophageal reflux disease)   . Constipation   . Basal cell carcinoma ~ 2012    "MOHS on right neck"  . High cholesterol     "creeping up; not taking RX yet" (02/12/2014)  . Hepatitis 1952    "yellow jaundice"  . Headache(784.0)     "5d X wk since fall w/concussion  in 06/2013" (02/12/2014)  . Anxiety   . Pancreatitis    Past Surgical History  Procedure Laterality Date  . US echocardiography  2008    EF 50-55% , trivial AR,hypokinesis of mid inferior wall  . Nuclear stress test  08/2010    EF 49%, Normal  . Cardiac catheterization  2008  . Anterior cervical decomp/discectomy fusion  2010  . Appendectomy    . Shoulder arthroscopy Left ?2007      "labrium repair"  . Cataract extraction w/ intraocular lens  implant, bilateral Bilateral 1990's  . Orif clavicle fracture Left 2006    using DePuy clavicle pin.  . Clavicle hardware removal Left 2007  . Mohs surgery Right ~ 2012    "neck; basal cell"     Medications: Prior to Admission medications   Medication Sig Start Date End Date Taking? Authorizing Provider  allopurinol (ZYLOPRIM) 100 MG tablet Take 100 mg by mouth daily.   Yes Historical Provider, MD  diltiazem (CARTIA XT) 120 MG 24 hr capsule Take 1 capsule (120 mg total) by mouth daily. 12/18/14  Yes Darlin Coco, MD  losartan (COZAAR) 25 MG tablet Take 1 tablet (25 mg total) by mouth daily. 12/18/14  Yes Darlin Coco, MD  pantoprazole (PROTONIX) 40 MG tablet Take 40 mg by mouth daily.  12/22/14  Yes Historical Provider, MD  zolpidem (AMBIEN) 10 MG tablet Take 1 tablet (10 mg total) by mouth at bedtime as needed for sleep. Patient taking differently: Take 10 mg by mouth at bedtime.  05/27/14  Yes Darlin Coco, MD  ondansetron (ZOFRAN) 4 MG tablet Take 1 tablet (4 mg total) by mouth every 6 (six) hours. 01/08/15   Al Corpus, PA-C  oxyCODONE-acetaminophen (PERCOCET/ROXICET) 5-325 MG per tablet Take 1-2 tablets by mouth every 6 (six) hours as needed for severe pain. 01/08/15   Al Corpus, PA-C    Allergies:   Allergies  Allergen Reactions  . Amlodipine Cough  . Codeine Itching and Nausea And Vomiting  . Lisinopril Cough  . Micardis Hct [Telmisartan-Hctz] Other (See Comments) and Cough    anxious  . Olmesartan Cough  . Toprol Xl [Metoprolol Succinate] Cough  . Triamterene Cough    Social History:  Ambulatory  independently   Lives at home  With family     reports that he has never smoked. He has never used smokeless tobacco. He reports that he drinks about 4.2 oz of alcohol per week. He reports that he does not use illicit drugs.    Family History: family history includes Healthy in his brother  and daughter; Other in his father and mother.    Physical Exam: Patient Vitals for the past 24 hrs:  BP Temp Temp src Pulse Resp SpO2 Height Weight  03/15/15 0030 166/85 mmHg - - 75 22 95 % - -  03/15/15 0000 167/86 mmHg - - 75 21 96 % - -  03/14/15 2330 167/80 mmHg - - 84 19 94 % - -  03/14/15 2300 169/88 mmHg - - 77 19 95 % - -  03/14/15 2250 (!) 157/104 mmHg - - 79 19 98 % - -  03/14/15 2100 188/85 mmHg - - 67 18 98 % - -  03/14/15 2030 191/83 mmHg - - (!) 58 21 97 % - -  03/14/15 2000 184/86 mmHg - - (!) 54 19 99 % - -  03/14/15 1930 (!) 203/92 mmHg - - (!) 55 17 99 % - -  03/14/15 1900 (!) 205/93 mmHg - - [name])  58 17 100 % - -  03/14/15 1622 (!) 136/110 mmHg 97.5 F (36.4 C) Oral 71 16 100 % 6\' 2"  (1.88 m) 85.866 kg (189 lb 4.8 oz)    1. General:  in No Acute distress 2. Psychological: Alert and  Oriented 3. Head/ENT:   Dry Mucous Membranes                          Head Non traumatic, neck supple                          Normal  Dentition 4. SKIN:  decreased Skin turgor,  Skin clean Dry and intact no rash 5. Heart: Regular rate and rhythm no Murmur, Rub or gallop 6. Lungs: Clear to auscultation bilaterally, no wheezes or crackles   7. Abdomen: Soft, severe Right Upper quadrant tenderness, Non distended 8. Lower extremities: no clubbing, cyanosis, or edema 9. Neurologically Grossly intact, moving all 4 extremities equally 10. MSK: Normal range of motion  body mass index is 24.29 kg/(m^2).   Labs on Admission:   Results for orders placed or performed during the hospital encounter of 03/14/15 (from the past 24 hour(s))  CBC with Differential     Status: Abnormal   Collection Time: 03/14/15  4:30 PM  Result Value Ref Range   WBC 7.2 4.0 - 10.5 K/uL   RBC 4.56 4.22 - 5.81 MIL/uL   Hemoglobin 16.1 13.0 - 17.0 g/dL   HCT 46.4 39.0 - 52.0 %   MCV 101.8 (H) 78.0 - 100.0 fL   MCH 35.3 (H) 26.0 - 34.0 pg   MCHC 34.7 30.0 - 36.0 g/dL   RDW 17.0 (H) 11.5 - 15.5 %   Platelets  268 150 - 400 K/uL   Neutrophils Relative % 55 43 - 77 %   Neutro Abs 3.9 1.7 - 7.7 K/uL   Lymphocytes Relative 33 12 - 46 %   Lymphs Abs 2.3 0.7 - 4.0 K/uL   Monocytes Relative 10 3 - 12 %   Monocytes Absolute 0.7 0.1 - 1.0 K/uL   Eosinophils Relative 2 0 - 5 %   Eosinophils Absolute 0.2 0.0 - 0.7 K/uL   Basophils Relative 0 0 - 1 %   Basophils Absolute 0.0 0.0 - 0.1 K/uL  Comprehensive metabolic panel     Status: Abnormal   Collection Time: 03/14/15  4:30 PM  Result Value Ref Range   Sodium 136 135 - 145 mmol/L   Potassium 3.7 3.5 - 5.1 mmol/L   Chloride 100 (L) 101 - 111 mmol/L   CO2 24 22 - 32 mmol/L   Glucose, Bld 128 (H) 65 - 99 mg/dL   BUN 9 6 - 20 mg/dL   Creatinine, Ser 1.05 0.61 - 1.24 mg/dL   Calcium 9.8 8.9 - 10.3 mg/dL   Total Protein 7.1 6.5 - 8.1 g/dL   Albumin 3.8 3.5 - 5.0 g/dL   AST 92 (H) 15 - 41 U/L   ALT 66 (H) 17 - 63 U/L   Alkaline Phosphatase 103 38 - 126 U/L   Total Bilirubin 2.3 (H) 0.3 - 1.2 mg/dL   GFR calc non Af Amer >60 >60 mL/min   GFR calc Af Amer >60 >60 mL/min   Anion gap 12 5 - 15  Lipase, blood     Status: Abnormal   Collection Time: 03/14/15  4:30 PM  Result Value Ref Range   Lipase  16 (L) 22 - 51 U/L  I-stat troponin, ED (only if pt is 70 y.o. or older & pain is above umbilicus)  not at [facility], [facility]     Status: None   Collection Time: 03/14/15  4:40 PM  Result Value Ref Range   Troponin i, poc 0.00 0.00 - 0.08 ng/mL   Comment 3          Troponin I     Status: None   Collection Time: 03/14/15  7:18 PM  Result Value Ref Range   Troponin I <0.03 <0.031 ng/mL  Urinalysis, dipstick only     Status: Abnormal   Collection Time: 03/14/15 10:48 PM  Result Value Ref Range   Specific Gravity, Urine 1.025 1.005 - 1.030   pH 7.5 5.0 - 8.0   Glucose, UA NEGATIVE NEGATIVE mg/dL   Hgb urine dipstick NEGATIVE NEGATIVE   Bilirubin Urine MODERATE (A) NEGATIVE   Ketones, ur 40 (A) NEGATIVE mg/dL   Protein, ur NEGATIVE NEGATIVE mg/dL    Urobilinogen, UA 2.0 (H) 0.0 - 1.0 mg/dL   Nitrite POSITIVE (A) NEGATIVE   Leukocytes, UA SMALL (A) NEGATIVE    UA nitrite positive  No results found for: HGBA1C  Estimated Creatinine Clearance: 77.2 mL/min (by C-G formula based on Cr of 1.05).  BNP (last 3 results) No results for input(s): PROBNP in the last 8760 hours.  Other results:  I have pearsonaly reviewed this: ECG REPORT  Rate: 75  Rhythm: NSR ST&T Change:  No acute ischemic changes, q waves   Filed Weights   03/14/15 1622  Weight: 85.866 kg (189 lb 4.8 oz)     Cultures: No results found for: SDES, SPECREQUEST, CULT, REPTSTATUS   Radiological Exams on Admission: US Abdomen Limited Ruq  03/14/2015   CLINICAL DATA:  Right upper quadrant pain. Prior appendectomy. Hypertension.  EXAM: US ABDOMEN LIMITED - RIGHT UPPER QUADRANT  COMPARISON:  CT abdomen and pelvis 08/14/2014. Ultrasound abdomen 02/12/2014.  FINDINGS: Gallbladder:  Sludge and stones are demonstrated in the dependent portion of the gallbladder. No gallbladder wall thickening. Murphy's sign is negative.  Common bile duct:  Diameter: 5.9 mm, normal  Liver:  Diffusely increased hepatic parenchymal echotexture are most likely to represent fatty infiltration. No discrete focal lesions identified.  IMPRESSION: Cholelithiasis and sludge in the gallbladder. Diffuse fatty infiltration of the liver.   Electronically Signed   By: Lucienne Capers M.D.   On: 03/14/2015 21:56    Chart has been reviewed  Family not  at  Bedside    Assessment/Plan 69 year old gentleman with history of alcohol abuse hypertension presents with significant right upper quadrant pain ultrasound significant for gallstones but no evidence of cholecystitis being admitted for further workup and surgical consult  Present on Admission:  . RUQ abdominal pain - admit for pain management, we'll obtain CT scan of the abdomen given severity of the pain to rule out any other etiology. As per surgery  recommendations will obtain HIDA scan in a.m. at this point no fever no elevated white blood cell count holding off on antibiotics as there is no source. I have personally discussed the case with Dr. Hulen Skains on call for general surgery who will see patient in consult.  . Hypertension continue home medications unable to tolerate by mouth  . Alcoholism patient states he quit drinking one half weeks ago. Non-tremulous no evidence of withdrawal. Continue to monitor     Prophylaxis: SCD    CODE STATUS:  FULL CODE   as  per patient    Disposition:  To home once workup is complete and patient is stable  Other plan as per orders.  I have spent a total of 65 min on this admission except for Marcello Moores taken to discuss case with Dr. Dede Query 03/15/2015, 1:12 AM  [facility]  Pager [phone]   after 2 AM please page floor coverage PA If 7AM-7PM, please contact the day team taking care of the patient  [email or web address]  Password [email or web address]

## 2015-03-15 NOTE — ED Notes (Signed)
Pt vomited up contrast; CT notified; CT will scan pt as soon as table is available

## 2015-03-15 NOTE — ED Notes (Signed)
Admitting MD at bedside.

## 2015-03-15 NOTE — Progress Notes (Signed)
[facility] PROGRESS NOTE  Brian Villegas RCV:[id number] DOB: 1946/06/20 DOA: 03/14/2015 PCP: Pcp Not In System  Assessment/Plan: RUQ abdominal pain, transaminases. Bilirubinemia.  [place]; gallbladder distended, suggesting mild wall thickening. Cholelithiasis.  Surgery following , planning cholecystectomy during this admission.  MRCP ordered.  IV fluids.  Continue with Zosyn.   Hypertension continue with Cardizem.   Alcoholism:  patient states he quit drinking one half weeks ago.; will order CIWA protocol.   Hypomagnesia; replete IV.   Code Status: Full Code.  Family Communication: care discussed with patient.  Disposition Plan: Remain in patient.    Consultants:  Surgery  Procedures: [place]; Cholelithiasis and sludge in the gallbladder. Diffuse fatty  infiltration of the liver.  Antibiotics:  Zosyn  HPI/Subjective: Still with abdominal pain, nausea.  Last alcohol was 2 weeks ago ? But then he said I don't know when it was.   Objective: Filed Vitals:   03/15/15 0335  BP: 167/87  Pulse: 96  Temp: 98.6 F (37 C)  Resp: 18    Intake/Output Summary (Last 24 hours) at 03/15/15 1034 Last data filed at 03/15/15 0900  Gross per 24 hour  Intake    220 ml  Output    125 ml  Net     95 ml   Filed Weights   03/14/15 1622 03/15/15 0335  Weight: 85.866 kg (189 lb 4.8 oz) 84.1 kg (185 lb 6.5 oz)    Exam:   General:  Alert, in no distress.   Cardiovascular: S 1, S 2 RRR  Respiratory: CTA  Abdomen: BS present, soft, mild tenderness  Musculoskeletal: no edema  Data Reviewed: Basic Metabolic Panel:  Recent Labs Lab 03/14/15 1630 03/15/15 0446  NA 136 136  K 3.7 3.5  CL 100* 101  CO2 24 25  GLUCOSE 128* 141*  BUN 9 9  CREATININE 1.05 1.01  CALCIUM 9.8 9.2  MG  --  1.6*  PHOS  --  3.0   Liver Function Tests:  Recent Labs Lab 03/14/15 1630 03/15/15 0446  AST 92* 87*  ALT 66* 63  ALKPHOS 103 92  BILITOT 2.3* 3.3*  PROT 7.1 6.9  ALBUMIN 3.8  3.7    Recent Labs Lab 03/14/15 1630  LIPASE 16*   No results for input(s): AMMONIA in the last 168 hours. CBC:  Recent Labs Lab 03/14/15 1630 03/15/15 0446  WBC 7.2 13.3*  NEUTROABS 3.9  --   HGB 16.1 15.5  HCT 46.4 44.6  MCV 101.8* 100.5*  PLT 268 253   Cardiac Enzymes:  Recent Labs Lab 03/14/15 1918  TROPONINI <0.03   BNP (last 3 results) No results for input(s): BNP in the last 8760 hours.  ProBNP (last 3 results) No results for input(s): PROBNP in the last 8760 hours.  CBG: No results for input(s): GLUCAP in the last 168 hours.  No results found for this or any previous visit (from the past 240 hour(s)).   Studies: Ct Abdomen Pelvis W Contrast  03/15/2015   CLINICAL DATA:  Right upper quadrant pain and vomiting. History of alcohol abuse and pancreatitis.  EXAM: CT ABDOMEN AND PELVIS WITH CONTRAST  TECHNIQUE: Multidetector CT imaging of the abdomen and pelvis was performed using the standard protocol following bolus administration of intravenous contrast.  CONTRAST:  135mL OMNIPAQUE IOHEXOL 300 MG/ML  SOLN  COMPARISON:  Ultrasound abdomen 03/14/2015. CT abdomen and pelvis 08/14/2014.  FINDINGS: Atelectasis in the lung bases.  Small esophageal hiatal hernia.  Diffuse fatty infiltration of the liver. Gallbladder  is mildly distended with mild wall thickening. Cholelithiasis. No bile duct dilatation. Pancreas, spleen, adrenal glands, abdominal aorta, inferior vena cava, and retroperitoneal lymph nodes are unremarkable. Low-attenuation lesions in the kidneys likely representing cysts. Largest on the right is 2.5 cm in diameter. No solid mass or hydronephrosis in either kidney. The stomach, small bowel, and colon are not abnormally distended. No free air or free fluid in the abdomen.  Pelvis: Prostate gland is not enlarged. Bladder wall is not thickened. No free or loculated pelvic fluid collections. No pelvic mass or lymphadenopathy. Appendix is surgically absent. No evidence  of diverticulitis. Degenerative changes in the spine. No destructive bone lesions. Old right rib fractures.  IMPRESSION: Diffuse fatty infiltration of the liver. Gallbladder is mildly distended with suggestion of mild wall thickening. Cholelithiasis. No pericholecystic infiltration but cholecystitis is not excluded. Pancreas is normal.   Electronically Signed   By: Lucienne Capers M.D.   On: 03/15/2015 03:37   US Abdomen Limited Ruq  03/14/2015   CLINICAL DATA:  Right upper quadrant pain. Prior appendectomy. Hypertension.  EXAM: US ABDOMEN LIMITED - RIGHT UPPER QUADRANT  COMPARISON:  CT abdomen and pelvis 08/14/2014. Ultrasound abdomen 02/12/2014.  FINDINGS: Gallbladder:  Sludge and stones are demonstrated in the dependent portion of the gallbladder. No gallbladder wall thickening. Murphy's sign is negative.  Common bile duct:  Diameter: 5.9 mm, normal  Liver:  Diffusely increased hepatic parenchymal echotexture are most likely to represent fatty infiltration. No discrete focal lesions identified.  IMPRESSION: Cholelithiasis and sludge in the gallbladder. Diffuse fatty infiltration of the liver.   Electronically Signed   By: Lucienne Capers M.D.   On: 03/14/2015 21:56    Scheduled Meds: . allopurinol  100 mg Oral Daily  . diltiazem  120 mg Oral Daily  . losartan  25 mg Oral Daily  . pantoprazole (PROTONIX) IV  40 mg Intravenous QHS  . piperacillin-tazobactam (ZOSYN)  IV  3.375 g Intravenous 3 times per day  . sodium chloride  3 mL Intravenous Q12H  . zolpidem  5 mg Oral QHS   Continuous Infusions: . sodium chloride 75 mL/hr at 03/15/15 0344    Active Problems:   Hypertension   Alcoholism   RUQ pain   RUQ abdominal pain    Time spent: 35 minutes.     Niel Hummer A  [facility] Pager [phone]. If 7PM-7AM, please contact night-coverage at [email or web address], password [facility] 03/15/2015, 10:34 AM  LOS: 0 days

## 2015-03-15 NOTE — Consult Note (Signed)
Reason for Consult:Gallstones and abdominal pain. Referring Physician: [name] is an 69 y.o. male.  HPI: Came to the ED with abdominal pain in the RUQ associated with nausea and vomiting.  Pain worsened in the ED after he had [place] which showed stones in his gallbladder without Murphy;s sign or other signs of acute cholecystitis.  Because of the worsening pain a CT was done which demonstrated some mild GB wall thickening, and stones.  No acute perforations.  Past Medical History  Diagnosis Date  . Hypertension     multiple medication intolerances  . Polycythemia     requires phlebotomies   . Alcohol consuption of more than two drinks per day   . Memory loss     "since fall w/concussion in 06/2013" (02/12/2014)  . Concussion with loss of consciousness 06/18/2013  . GERD (gastroesophageal reflux disease)   . Constipation   . Basal cell carcinoma ~ 2012    "MOHS on right neck"  . High cholesterol     "creeping up; not taking RX yet" (02/12/2014)  . Hepatitis 1952    "yellow jaundice"  . Headache(784.0)     "5d X wk since fall w/concussion in 06/2013" (02/12/2014)  . Anxiety   . Pancreatitis     Past Surgical History  Procedure Laterality Date  . US echocardiography  2008    EF 50-55% , trivial AR,hypokinesis of mid inferior wall  . Nuclear stress test  08/2010    EF 49%, Normal  . Cardiac catheterization  2008  . Anterior cervical decomp/discectomy fusion  2010  . Appendectomy    . Shoulder arthroscopy Left ?2007    "labrium repair"  . Cataract extraction w/ intraocular lens  implant, bilateral Bilateral 1990's  . Orif clavicle fracture Left 2006    using DePuy clavicle pin.  . Clavicle hardware removal Left 2007  . Mohs surgery Right ~ 2012    "neck; basal cell"    Family History  Problem Relation Age of Onset  . Other Mother     Died, 90 complicated from esophageal stricutre  . Other Father     Died, 62 from carbon monoxide poisoning  . Healthy Brother    . Healthy Daughter     Social History:  reports that he has never smoked. He has never used smokeless tobacco. He reports that he drinks about 4.2 oz of alcohol per week. He reports that he does not use illicit drugs.  Allergies:  Allergies  Allergen Reactions  . Amlodipine Cough  . Codeine Itching and Nausea And Vomiting  . Lisinopril Cough  . Micardis Hct [Telmisartan-Hctz] Other (See Comments) and Cough    anxious  . Olmesartan Cough  . Toprol Xl [Metoprolol Succinate] Cough  . Triamterene Cough    Medications:  Prior to Admission:  Prescriptions prior to admission  Medication Sig Dispense Refill Last Dose  . allopurinol (ZYLOPRIM) 100 MG tablet Take 100 mg by mouth daily.   03/14/2015 at Unknown time  . diltiazem (CARTIA XT) 120 MG 24 hr capsule Take 1 capsule (120 mg total) by mouth daily. 90 capsule 3 03/13/2015 at Unknown time  . losartan (COZAAR) 25 MG tablet Take 1 tablet (25 mg total) by mouth daily. 180 tablet 3 03/14/2015 at Unknown time  . pantoprazole (PROTONIX) 40 MG tablet Take 40 mg by mouth daily.    03/14/2015 at Unknown time  . zolpidem (AMBIEN) 10 MG tablet Take 1 tablet (10 mg total) by mouth at bedtime  as needed for sleep. (Patient taking differently: Take 10 mg by mouth at bedtime. ) 30 tablet 5 03/13/2015 at Unknown time  . ondansetron (ZOFRAN) 4 MG tablet Take 1 tablet (4 mg total) by mouth every 6 (six) hours. 12 tablet 0   . oxyCODONE-acetaminophen (PERCOCET/ROXICET) 5-325 MG per tablet Take 1-2 tablets by mouth every 6 (six) hours as needed for severe pain. 20 tablet 0     Results for orders placed or performed during the hospital encounter of 03/14/15 (from the past 48 hour(s))  CBC with Differential     Status: Abnormal   Collection Time: 03/14/15  4:30 PM  Result Value Ref Range   WBC 7.2 4.0 - 10.5 K/uL   RBC 4.56 4.22 - 5.81 MIL/uL   Hemoglobin 16.1 13.0 - 17.0 g/dL   HCT 46.4 39.0 - 52.0 %   MCV 101.8 (H) 78.0 - 100.0 fL   MCH 35.3 (H) 26.0 - 34.0  pg   MCHC 34.7 30.0 - 36.0 g/dL   RDW 17.0 (H) 11.5 - 15.5 %   Platelets 268 150 - 400 K/uL   Neutrophils Relative % 55 43 - 77 %   Neutro Abs 3.9 1.7 - 7.7 K/uL   Lymphocytes Relative 33 12 - 46 %   Lymphs Abs 2.3 0.7 - 4.0 K/uL   Monocytes Relative 10 3 - 12 %   Monocytes Absolute 0.7 0.1 - 1.0 K/uL   Eosinophils Relative 2 0 - 5 %   Eosinophils Absolute 0.2 0.0 - 0.7 K/uL   Basophils Relative 0 0 - 1 %   Basophils Absolute 0.0 0.0 - 0.1 K/uL  Comprehensive metabolic panel     Status: Abnormal   Collection Time: 03/14/15  4:30 PM  Result Value Ref Range   Sodium 136 135 - 145 mmol/L   Potassium 3.7 3.5 - 5.1 mmol/L   Chloride 100 (L) 101 - 111 mmol/L   CO2 24 22 - 32 mmol/L   Glucose, Bld 128 (H) 65 - 99 mg/dL   BUN 9 6 - 20 mg/dL   Creatinine, Ser 1.05 0.61 - 1.24 mg/dL   Calcium 9.8 8.9 - 10.3 mg/dL   Total Protein 7.1 6.5 - 8.1 g/dL   Albumin 3.8 3.5 - 5.0 g/dL   AST 92 (H) 15 - 41 U/L   ALT 66 (H) 17 - 63 U/L   Alkaline Phosphatase 103 38 - 126 U/L   Total Bilirubin 2.3 (H) 0.3 - 1.2 mg/dL   GFR calc non Af Amer >60 >60 mL/min   GFR calc Af Amer >60 >60 mL/min    Comment: (NOTE) The eGFR has been calculated using the CKD EPI equation. This calculation has not been validated in all clinical situations. eGFR's persistently <60 mL/min signify possible Chronic Kidney Disease.    Anion gap 12 5 - 15  Lipase, blood     Status: Abnormal   Collection Time: 03/14/15  4:30 PM  Result Value Ref Range   Lipase 16 (L) 22 - 51 U/L  I-stat troponin, ED (only if pt is 70 y.o. or older & pain is above umbilicus)  not at [facility], [facility]     Status: None   Collection Time: 03/14/15  4:40 PM  Result Value Ref Range   Troponin i, poc 0.00 0.00 - 0.08 ng/mL   Comment 3            Comment: Due to the release kinetics of cTnI, a negative result within the first hours  of the onset of symptoms does not rule out myocardial infarction with certainty. If myocardial infarction is still  suspected, repeat the test at appropriate intervals.   Troponin I     Status: None   Collection Time: 03/14/15  7:18 PM  Result Value Ref Range   Troponin I <0.03 <0.031 ng/mL    Comment:        NO INDICATION OF MYOCARDIAL INJURY.   Urinalysis, dipstick only     Status: Abnormal   Collection Time: 03/14/15 10:48 PM  Result Value Ref Range   Specific Gravity, Urine 1.025 1.005 - 1.030   pH 7.5 5.0 - 8.0   Glucose, UA NEGATIVE NEGATIVE mg/dL   Hgb urine dipstick NEGATIVE NEGATIVE   Bilirubin Urine MODERATE (A) NEGATIVE   Ketones, ur 40 (A) NEGATIVE mg/dL   Protein, ur NEGATIVE NEGATIVE mg/dL   Urobilinogen, UA 2.0 (H) 0.0 - 1.0 mg/dL   Nitrite POSITIVE (A) NEGATIVE   Leukocytes, UA SMALL (A) NEGATIVE  Lactic acid, plasma     Status: None   Collection Time: 03/15/15  2:12 AM  Result Value Ref Range   Lactic Acid, Venous 1.6 0.5 - 2.0 mmol/L  Magnesium     Status: Abnormal   Collection Time: 03/15/15  4:46 AM  Result Value Ref Range   Magnesium 1.6 (L) 1.7 - 2.4 mg/dL  Phosphorus     Status: None   Collection Time: 03/15/15  4:46 AM  Result Value Ref Range   Phosphorus 3.0 2.5 - 4.6 mg/dL  TSH     Status: None   Collection Time: 03/15/15  4:46 AM  Result Value Ref Range   TSH 1.418 0.350 - 4.500 uIU/mL  Comprehensive metabolic panel     Status: Abnormal   Collection Time: 03/15/15  4:46 AM  Result Value Ref Range   Sodium 136 135 - 145 mmol/L   Potassium 3.5 3.5 - 5.1 mmol/L   Chloride 101 101 - 111 mmol/L   CO2 25 22 - 32 mmol/L   Glucose, Bld 141 (H) 65 - 99 mg/dL   BUN 9 6 - 20 mg/dL   Creatinine, Ser 1.01 0.61 - 1.24 mg/dL   Calcium 9.2 8.9 - 10.3 mg/dL   Total Protein 6.9 6.5 - 8.1 g/dL   Albumin 3.7 3.5 - 5.0 g/dL   AST 87 (H) 15 - 41 U/L   ALT 63 17 - 63 U/L   Alkaline Phosphatase 92 38 - 126 U/L   Total Bilirubin 3.3 (H) 0.3 - 1.2 mg/dL   GFR calc non Af Amer >60 >60 mL/min   GFR calc Af Amer >60 >60 mL/min    Comment: (NOTE) The eGFR has been  calculated using the CKD EPI equation. This calculation has not been validated in all clinical situations. eGFR's persistently <60 mL/min signify possible Chronic Kidney Disease.    Anion gap 10 5 - 15  CBC     Status: Abnormal   Collection Time: 03/15/15  4:46 AM  Result Value Ref Range   WBC 13.3 (H) 4.0 - 10.5 K/uL   RBC 4.44 4.22 - 5.81 MIL/uL   Hemoglobin 15.5 13.0 - 17.0 g/dL   HCT 44.6 39.0 - 52.0 %   MCV 100.5 (H) 78.0 - 100.0 fL   MCH 34.9 (H) 26.0 - 34.0 pg   MCHC 34.8 30.0 - 36.0 g/dL   RDW 16.9 (H) 11.5 - 15.5 %   Platelets 253 150 - 400 K/uL    Ct Abdomen Pelvis  W Contrast  03/15/2015   CLINICAL DATA:  Right upper quadrant pain and vomiting. History of alcohol abuse and pancreatitis.  EXAM: CT ABDOMEN AND PELVIS WITH CONTRAST  TECHNIQUE: Multidetector CT imaging of the abdomen and pelvis was performed using the standard protocol following bolus administration of intravenous contrast.  CONTRAST:  13m OMNIPAQUE IOHEXOL 300 MG/ML  SOLN  COMPARISON:  Ultrasound abdomen 03/14/2015. CT abdomen and pelvis 08/14/2014.  FINDINGS: Atelectasis in the lung bases.  Small esophageal hiatal hernia.  Diffuse fatty infiltration of the liver. Gallbladder is mildly distended with mild wall thickening. Cholelithiasis. No bile duct dilatation. Pancreas, spleen, adrenal glands, abdominal aorta, inferior vena cava, and retroperitoneal lymph nodes are unremarkable. Low-attenuation lesions in the kidneys likely representing cysts. Largest on the right is 2.5 cm in diameter. No solid mass or hydronephrosis in either kidney. The stomach, small bowel, and colon are not abnormally distended. No free air or free fluid in the abdomen.  Pelvis: Prostate gland is not enlarged. Bladder wall is not thickened. No free or loculated pelvic fluid collections. No pelvic mass or lymphadenopathy. Appendix is surgically absent. No evidence of diverticulitis. Degenerative changes in the spine. No destructive bone lesions.  Old right rib fractures.  IMPRESSION: Diffuse fatty infiltration of the liver. Gallbladder is mildly distended with suggestion of mild wall thickening. Cholelithiasis. No pericholecystic infiltration but cholecystitis is not excluded. Pancreas is normal.   Electronically Signed   By: WLucienne CapersM.D.   On: 03/15/2015 03:37   [place]dom[place] Limited Ruq  03/14/2015   CLINICAL DATA:  Right upper quadrant pain. Prior appendectomy. Hypertension.  EXAM: [place] LIMITED - RIGHT UPPER QUADRANT  COMPARISON:  CT abdomen and pelvis 08/14/2014. Ultrasound abdomen 02/12/2014.  FINDINGS: Gallbladder:  Sludge and stones are demonstrated in the dependent portion of the gallbladder. No gallbladder wall thickening. Murphy's sign is negative.  Common bile duct:  Diameter: 5.9 mm, normal  Liver:  Diffusely increased hepatic parenchymal echotexture are most likely to represent fatty infiltration. No discrete focal lesions identified.  IMPRESSION: Cholelithiasis and sludge in the gallbladder. Diffuse fatty infiltration of the liver.   Electronically Signed   By: WLucienne CapersM.D.   On: 03/14/2015 21:56    ROS Blood pressure 167/87, pulse 96, temperature 98.6 F (37 C), temperature source Oral, resp. rate 18, height 6' 2"  (1.88 m), weight 84.1 kg (185 lb 6.5 oz), SpO2 99 %. Physical Exam  Vitals reviewed. Constitutional: He is oriented to person, place, and time. He appears well-developed and well-nourished.  HENT:  Head: Normocephalic and atraumatic.  Eyes: Conjunctivae and EOM are normal. Pupils are equal, round, and reactive to light.  Neck: Normal range of motion. Neck supple.  Cardiovascular: Normal rate, regular rhythm and normal heart sounds.   Respiratory: Effort normal and breath sounds normal.  GI: Soft. Normal appearance and bowel sounds are normal. There is tenderness (mild). There is no rigidity, no rebound, no guarding and negative Murphy's sign.  Musculoskeletal: Normal range of motion.   Neurological: He is alert and oriented to person, place, and time.  Skin: Skin is warm and dry.    Assessment/Plan: Gallstones and possible biliary colic History of ETOH abuse and use, pancreatitis about one year ago.  The etiology was thought then to be alcohol. His WBC has increased a bit since yesterday, but his bilirubin has also increased.  He is scheduled for a HIDA scan today.  More than likely he will need cholecystectomy during this admission. May need MRCP if  bilirubin continues to increase.  Brian Villegas 03/15/2015, 6:48 AM

## 2015-03-15 NOTE — ED Notes (Signed)
Patient transported to CT 

## 2015-03-15 NOTE — Progress Notes (Signed)
Patient ID: Brian Villegas, male   DOB: Apr 16, 1946, 69 y.o.   MRN: [id number]  MRCP negative for obstructing stone. Will plan lap chole with IOC tomorrow I discussed the procedure in detail. We discussed the risks and benefits of a laparoscopic cholecystectomy and possible cholangiogram including, but not limited to bleeding, infection, injury to surrounding structures such as the intestine or liver, bile leak, retained gallstones, need to convert to an open procedure, prolonged diarrhea, blood clots such as  DVT, common bile duct injury, anesthesia risks, and possible need for additional procedures.  The likelihood of improvement in symptoms and return to the patient's normal status is good. We discussed the typical post-operative recovery course.

## 2015-03-15 NOTE — Progress Notes (Signed)
Patient ID: Brian Villegas, male   DOB: Sep 12, 1945, 69 y.o.   MRN: [id number]    Subjective: Pt still having some RUQ abdominal pain.  No nausea.    Objective: Vital signs in last 24 hours: Temp:  [97.5 F (36.4 C)-98.6 F (37 C)] 98.6 F (37 C) (07/11 0335) Pulse Rate:  [54-96] 96 (07/11 0335) Resp:  [13-22] 18 (07/11 0335) BP: (136-205)/(80-110) 167/87 mmHg (07/11 0335) SpO2:  [94 %-100 %] 99 % (07/11 0335) Weight:  [84.1 kg (185 lb 6.5 oz)-85.866 kg (189 lb 4.8 oz)] 84.1 kg (185 lb 6.5 oz) (07/11 0335) Last BM Date: 03/13/15  Intake/Output from previous day: 07/10 0701 - 07/11 0700 In: 220 [I.V.:170; IV Piggyback:50] Out: 125 [Urine:125] Intake/Output this shift:    PE: Abd: soft, tender in RUQ, no guarding, no epigastric or LUQ tenderness, +BS, ND Heart: regular Lungs: CTAB  Lab Results:   Recent Labs  03/14/15 1630 03/15/15 0446  WBC 7.2 13.3*  HGB 16.1 15.5  HCT 46.4 44.6  PLT 268 253   BMET  Recent Labs  03/14/15 1630 03/15/15 0446  NA 136 136  K 3.7 3.5  CL 100* 101  CO2 24 25  GLUCOSE 128* 141*  BUN 9 9  CREATININE 1.05 1.01  CALCIUM 9.8 9.2   PT/INR No results for input(s): LABPROT, INR in the last 72 hours. CMP     Component Value Date/Time   NA 136 03/15/2015 0446   K 3.5 03/15/2015 0446   CL 101 03/15/2015 0446   CO2 25 03/15/2015 0446   GLUCOSE 141* 03/15/2015 0446   BUN 9 03/15/2015 0446   CREATININE 1.01 03/15/2015 0446   CALCIUM 9.2 03/15/2015 0446   PROT 6.9 03/15/2015 0446   ALBUMIN 3.7 03/15/2015 0446   AST 87* 03/15/2015 0446   ALT 63 03/15/2015 0446   ALKPHOS 92 03/15/2015 0446   BILITOT 3.3* 03/15/2015 0446   GFRNONAA >60 03/15/2015 0446   GFRAA >60 03/15/2015 0446   Lipase     Component Value Date/Time   LIPASE 16* 03/14/2015 1630       Studies/Results: Ct Abdomen Pelvis W Contrast  03/15/2015   CLINICAL DATA:  Right upper quadrant pain and vomiting. History of alcohol abuse and pancreatitis.  EXAM: CT  ABDOMEN AND PELVIS WITH CONTRAST  TECHNIQUE: Multidetector CT imaging of the abdomen and pelvis was performed using the standard protocol following bolus administration of intravenous contrast.  CONTRAST:  125mL OMNIPAQUE IOHEXOL 300 MG/ML  SOLN  COMPARISON:  Ultrasound abdomen 03/14/2015. CT abdomen and pelvis 08/14/2014.  FINDINGS: Atelectasis in the lung bases.  Small esophageal hiatal hernia.  Diffuse fatty infiltration of the liver. Gallbladder is mildly distended with mild wall thickening. Cholelithiasis. No bile duct dilatation. Pancreas, spleen, adrenal glands, abdominal aorta, inferior vena cava, and retroperitoneal lymph nodes are unremarkable. Low-attenuation lesions in the kidneys likely representing cysts. Largest on the right is 2.5 cm in diameter. No solid mass or hydronephrosis in either kidney. The stomach, small bowel, and colon are not abnormally distended. No free air or free fluid in the abdomen.  Pelvis: Prostate gland is not enlarged. Bladder wall is not thickened. No free or loculated pelvic fluid collections. No pelvic mass or lymphadenopathy. Appendix is surgically absent. No evidence of diverticulitis. Degenerative changes in the spine. No destructive bone lesions. Old right rib fractures.  IMPRESSION: Diffuse fatty infiltration of the liver. Gallbladder is mildly distended with suggestion of mild wall thickening. Cholelithiasis. No pericholecystic infiltration but cholecystitis is  not excluded. Pancreas is normal.   Electronically Signed   By: Lucienne Capers M.D.   On: 03/15/2015 03:37   US Abdomen Limited Ruq  03/14/2015   CLINICAL DATA:  Right upper quadrant pain. Prior appendectomy. Hypertension.  EXAM: US ABDOMEN LIMITED - RIGHT UPPER QUADRANT  COMPARISON:  CT abdomen and pelvis 08/14/2014. Ultrasound abdomen 02/12/2014.  FINDINGS: Gallbladder:  Sludge and stones are demonstrated in the dependent portion of the gallbladder. No gallbladder wall thickening. Murphy's sign is  negative.  Common bile duct:  Diameter: 5.9 mm, normal  Liver:  Diffusely increased hepatic parenchymal echotexture are most likely to represent fatty infiltration. No discrete focal lesions identified.  IMPRESSION: Cholelithiasis and sludge in the gallbladder. Diffuse fatty infiltration of the liver.   Electronically Signed   By: Lucienne Capers M.D.   On: 03/14/2015 21:56    Anti-infectives: Anti-infectives    Start     Dose/Rate Route Frequency Ordered Stop   03/15/15 0415  piperacillin-tazobactam (ZOSYN) IVPB 3.375 g     3.375 g 12.5 mL/hr over 240 Minutes Intravenous 3 times per day 03/15/15 0412         Assessment/Plan  1. Abdominal pain, biliary colic vs cholecystitis -TB up to 3.3.  Will DC HIDA scan and do MRCP to rule out choledocholithiasis.  If this is ok, then will likely proceed with lap chole tomorrow.  If this is positive for CBD stone, GI will need to be called. -CT is not suggestive of cirrhosis as cause of elevated TB, but just fatty infiltration 2. ETOH abuse -per medicine  3. H/o hepatitis 4. Polycythemia vera  LOS: 0 days    Kanoelani Dobies E 03/15/2015, 9:57 AM Pager: [phone]

## 2015-03-15 NOTE — Progress Notes (Signed)
Utilization Review Completed.Brian Villegas T7/07/2015  

## 2015-03-16 ENCOUNTER — Inpatient Hospital Stay (HOSPITAL_COMMUNITY): Payer: Medicare Other | Admitting: Certified Registered Nurse Anesthetist

## 2015-03-16 ENCOUNTER — Encounter (HOSPITAL_COMMUNITY)
Admission: EM | Disposition: A | Discharge status: Home / Self Care | Payer: Self-pay | Source: Home / Self Care | Attending: Internal Medicine

## 2015-03-16 ENCOUNTER — Encounter (HOSPITAL_COMMUNITY): Payer: Self-pay | Admitting: Certified Registered Nurse Anesthetist

## 2015-03-16 HISTORY — PX: CHOLECYSTECTOMY: SHX55

## 2015-03-16 LAB — COMPREHENSIVE METABOLIC PANEL
ALK PHOS: 89 U/L (ref 38–126)
ALT: 60 U/L (ref 17–63)
AST: 63 U/L — AB (ref 15–41)
Albumin: 3 g/dL — ABNORMAL LOW (ref 3.5–5.0)
Anion gap: 7 (ref 5–15)
BILIRUBIN TOTAL: 3 mg/dL — AB (ref 0.3–1.2)
BUN: 9 mg/dL (ref 6–20)
CALCIUM: 8.7 mg/dL — AB (ref 8.9–10.3)
CHLORIDE: 102 mmol/L (ref 101–111)
CO2: 24 mmol/L (ref 22–32)
Creatinine, Ser: 1.01 mg/dL (ref 0.61–1.24)
GFR calc Af Amer: >60 mL/min (ref >60)
GFR calc non Af Amer: >60 mL/min (ref >60)
Glucose, Bld: 156 mg/dL — ABNORMAL HIGH (ref 65–99)
POTASSIUM: 4.2 mmol/L (ref 3.5–5.1)
SODIUM: 133 mmol/L — AB (ref 135–145)
Total Protein: 6.6 g/dL (ref 6.5–8.1)

## 2015-03-16 LAB — CBC
HEMATOCRIT: 41.4 % (ref 39.0–52.0)
HEMOGLOBIN: 14.4 g/dL (ref 13.0–17.0)
MCH: 34.7 pg — ABNORMAL HIGH (ref 26.0–34.0)
MCHC: 34.8 g/dL (ref 30.0–36.0)
MCV: 99.8 fL (ref 78.0–100.0)
Platelets: 218 10*3/uL (ref 150–400)
RBC: 4.15 MIL/uL — ABNORMAL LOW (ref 4.22–5.81)
RDW: 17 % — ABNORMAL HIGH (ref 11.5–15.5)
WBC: 17.9 10*3/uL — ABNORMAL HIGH (ref 4.0–10.5)

## 2015-03-16 LAB — SURGICAL PCR SCREEN
MRSA, PCR: NEGATIVE
STAPHYLOCOCCUS AUREUS: NEGATIVE

## 2015-03-16 LAB — MAGNESIUM: Magnesium: 2 mg/dL (ref 1.7–2.4)

## 2015-03-16 LAB — PROTIME-INR
INR: 1.19 (ref 0.00–1.49)
Prothrombin Time: 15.3 seconds — ABNORMAL HIGH (ref 11.6–15.2)

## 2015-03-16 SURGERY — LAPAROSCOPIC CHOLECYSTECTOMY
Anesthesia: General | Site: Abdomen

## 2015-03-16 MED ORDER — PROPOFOL 10 MG/ML IV BOLUS
INTRAVENOUS | Status: DC | PRN
  Administered 2015-03-16: 200 mg via INTRAVENOUS

## 2015-03-16 MED ORDER — MEPERIDINE HCL 25 MG/ML IJ SOLN: 6.2500 mg | INTRAMUSCULAR | Status: DC | PRN

## 2015-03-16 MED ORDER — LACTATED RINGERS IV SOLN
INTRAVENOUS | Status: DC
  Administered 2015-03-16: 10:00:00 via INTRAVENOUS

## 2015-03-16 MED ORDER — HYDROMORPHONE HCL 1 MG/ML IJ SOLN: 0.2500 mg | INTRAMUSCULAR | Status: DC | PRN

## 2015-03-16 MED ORDER — ONDANSETRON HCL 4 MG/2ML IJ SOLN
INTRAMUSCULAR | Status: AC
  Filled 2015-03-16: qty 10

## 2015-03-16 MED ORDER — FENTANYL CITRATE (PF) 100 MCG/2ML IJ SOLN
INTRAMUSCULAR | Status: DC | PRN
  Administered 2015-03-16 (×5): 50 ug via INTRAVENOUS

## 2015-03-16 MED ORDER — FENTANYL CITRATE (PF) 250 MCG/5ML IJ SOLN
INTRAMUSCULAR | Status: AC
  Filled 2015-03-16: qty 5

## 2015-03-16 MED ORDER — PHENYLEPHRINE 40 MCG/ML (10ML) SYRINGE FOR IV PUSH (FOR BLOOD PRESSURE SUPPORT)
PREFILLED_SYRINGE | INTRAVENOUS | Status: AC
  Filled 2015-03-16: qty 30

## 2015-03-16 MED ORDER — NEOSTIGMINE METHYLSULFATE 10 MG/10ML IV SOLN
INTRAVENOUS | Status: DC | PRN
  Administered 2015-03-16: 3 mg via INTRAVENOUS

## 2015-03-16 MED ORDER — DEXAMETHASONE SODIUM PHOSPHATE 4 MG/ML IJ SOLN
INTRAMUSCULAR | Status: DC | PRN
  Administered 2015-03-16: 4 mg via INTRAVENOUS

## 2015-03-16 MED ORDER — LIDOCAINE HCL (CARDIAC) 20 MG/ML IV SOLN
INTRAVENOUS | Status: DC | PRN
  Administered 2015-03-16: 60 mg via INTRAVENOUS

## 2015-03-16 MED ORDER — BUPIVACAINE-EPINEPHRINE (PF) 0.25% -1:200000 IJ SOLN
INTRAMUSCULAR | Status: AC
  Filled 2015-03-16: qty 30

## 2015-03-16 MED ORDER — MIDAZOLAM HCL 2 MG/2ML IJ SOLN
INTRAMUSCULAR | Status: AC
  Filled 2015-03-16: qty 2

## 2015-03-16 MED ORDER — PROMETHAZINE HCL 25 MG/ML IJ SOLN: 6.2500 mg | INTRAMUSCULAR | Status: DC | PRN

## 2015-03-16 MED ORDER — SUCCINYLCHOLINE CHLORIDE 20 MG/ML IJ SOLN
INTRAMUSCULAR | Status: DC | PRN
  Administered 2015-03-16: 120 mg via INTRAVENOUS

## 2015-03-16 MED ORDER — DEXAMETHASONE SODIUM PHOSPHATE 4 MG/ML IJ SOLN
INTRAMUSCULAR | Status: AC
  Filled 2015-03-16: qty 2

## 2015-03-16 MED ORDER — ROCURONIUM BROMIDE 100 MG/10ML IV SOLN
INTRAVENOUS | Status: DC | PRN
  Administered 2015-03-16: 20 mg via INTRAVENOUS

## 2015-03-16 MED ORDER — LACTATED RINGERS IV SOLN
INTRAVENOUS | Status: DC | PRN
  Administered 2015-03-16 (×2): via INTRAVENOUS

## 2015-03-16 MED ORDER — MIDAZOLAM HCL 5 MG/5ML IJ SOLN
INTRAMUSCULAR | Status: DC | PRN
  Administered 2015-03-16: 2 mg via INTRAVENOUS

## 2015-03-16 MED ORDER — BUPIVACAINE-EPINEPHRINE 0.25% -1:200000 IJ SOLN
INTRAMUSCULAR | Status: DC | PRN
  Administered 2015-03-16: 20 mL

## 2015-03-16 MED ORDER — SODIUM CHLORIDE 0.9 % IR SOLN
Status: DC | PRN
  Administered 2015-03-16: 1000 mL

## 2015-03-16 MED ORDER — OXYCODONE HCL 5 MG PO TABS
5.0000 mg | ORAL_TABLET | ORAL | Status: DC | PRN
  Filled 2015-03-16: qty 2

## 2015-03-16 MED ORDER — GLYCOPYRROLATE 0.2 MG/ML IJ SOLN
INTRAMUSCULAR | Status: AC
  Filled 2015-03-16: qty 4

## 2015-03-16 MED ORDER — 0.9 % SODIUM CHLORIDE (POUR BTL) OPTIME
TOPICAL | Status: DC | PRN
  Administered 2015-03-16: 1000 mL

## 2015-03-16 MED ORDER — MORPHINE SULFATE 2 MG/ML IJ SOLN
1.0000 mg | INTRAMUSCULAR | Status: DC | PRN
  Administered 2015-03-16 (×3): 2 mg via INTRAVENOUS
  Administered 2015-03-17: 1 mg via INTRAVENOUS
  Administered 2015-03-17 (×2): 2 mg via INTRAVENOUS
  Filled 2015-03-16 (×7): qty 1

## 2015-03-16 MED ORDER — IOHEXOL 300 MG/ML  SOLN
INTRAMUSCULAR | Status: DC | PRN
  Administered 2015-03-16: 50 mL

## 2015-03-16 MED ORDER — HYDROMORPHONE HCL 2 MG PO TABS
2.0000 mg | ORAL_TABLET | Freq: Four times a day (QID) | ORAL | Status: DC | PRN
  Filled 2015-03-16: qty 1

## 2015-03-16 MED ORDER — GLYCOPYRROLATE 0.2 MG/ML IJ SOLN
INTRAMUSCULAR | Status: DC | PRN
  Administered 2015-03-16: 0.4 mg via INTRAVENOUS

## 2015-03-16 SURGICAL SUPPLY — 35 items
APPLIER CLIP 5 13 M/L LIGAMAX5 (MISCELLANEOUS) ×3
APR CLP MED LRG 5 ANG JAW (MISCELLANEOUS) ×2
BAG SPEC RTRVL LRG 6X4 10 (ENDOMECHANICALS) ×2
BLADE SURG CLIPPER 3M 9600 (MISCELLANEOUS) IMPLANT
CANISTER SUCTION 2500CC (MISCELLANEOUS) ×3 IMPLANT
CHLORAPREP W/TINT 26ML (MISCELLANEOUS) ×3 IMPLANT
CLIP APPLIE 5 13 M/L LIGAMAX5 (MISCELLANEOUS) ×2 IMPLANT
COVER MAYO STAND STRL (DRAPES) ×3 IMPLANT
COVER SURGICAL LIGHT HANDLE (MISCELLANEOUS) ×3 IMPLANT
DRAPE C-ARM 42X72 X-RAY (DRAPES) ×3 IMPLANT
ELECT REM PT RETURN 9FT ADLT (ELECTROSURGICAL) ×3
ELECTRODE REM PT RTRN 9FT ADLT (ELECTROSURGICAL) ×2 IMPLANT
GLOVE SURG SIGNA 7.5 PF LTX (GLOVE) ×3 IMPLANT
GOWN STRL REUS W/ TWL LRG LVL3 (GOWN DISPOSABLE) ×4 IMPLANT
GOWN STRL REUS W/ TWL XL LVL3 (GOWN DISPOSABLE) ×2 IMPLANT
GOWN STRL REUS W/TWL LRG LVL3 (GOWN DISPOSABLE) ×6
GOWN STRL REUS W/TWL XL LVL3 (GOWN DISPOSABLE) ×3
KIT BASIN OR (CUSTOM PROCEDURE TRAY) ×3 IMPLANT
KIT ROOM TURNOVER OR (KITS) ×3 IMPLANT
LIQUID BAND (GAUZE/BANDAGES/DRESSINGS) ×12 IMPLANT
NS IRRIG 1000ML POUR BTL (IV SOLUTION) ×3 IMPLANT
PAD ARMBOARD 7.5X6 YLW CONV (MISCELLANEOUS) ×3 IMPLANT
POUCH SPECIMEN RETRIEVAL 10MM (ENDOMECHANICALS) ×3 IMPLANT
SCISSORS LAP 5X35 DISP (ENDOMECHANICALS) ×3 IMPLANT
SET CHOLANGIOGRAPH 5 50 .035 (SET/KITS/TRAYS/PACK) ×3 IMPLANT
SET IRRIG TUBING LAPAROSCOPIC (IRRIGATION / IRRIGATOR) ×3 IMPLANT
SLEEVE ENDOPATH XCEL 5M (ENDOMECHANICALS) ×6 IMPLANT
SPECIMEN JAR SMALL (MISCELLANEOUS) ×3 IMPLANT
SUT MON AB 4-0 PC3 18 (SUTURE) ×3 IMPLANT
TOWEL OR 17X24 6PK STRL BLUE (TOWEL DISPOSABLE) ×3 IMPLANT
TOWEL OR 17X26 10 PK STRL BLUE (TOWEL DISPOSABLE) ×3 IMPLANT
TRAY LAPAROSCOPIC MC (CUSTOM PROCEDURE TRAY) ×3 IMPLANT
TROCAR XCEL BLUNT TIP 100MML (ENDOMECHANICALS) ×3 IMPLANT
TROCAR XCEL NON-BLD 5MMX100MML (ENDOMECHANICALS) ×3 IMPLANT
TUBING INSUFFLATION (TUBING) ×3 IMPLANT

## 2015-03-16 NOTE — Progress Notes (Signed)
[facility] PROGRESS NOTE  Brian Villegas BBC:[id number] DOB: Apr 04, 1946 DOA: 03/14/2015 PCP: Pcp Not In System  Assessment/Plan: Brian Villegas is a 69 y.o. male with  past medical history of Hypertension; Polycythemia; Alcohol consuption of more than two drinks per day; Memory loss; Concussion with loss of consciousness (06/18/2013); GERD,  High cholesterol; Hepatitis (1952); and Pancreatitis. Admitted with RUQ abdominal pain. MRCP consistent with cholecystitis. Plan for cholecystectomy 7-12.  RUQ abdominal pain, transaminases. Bilirubinemia.  [place]; gallbladder distended, suggesting mild wall thickening. Cholelithiasis.  Surgery following , planning cholecystectomy today. Will Check INR, needs to be follow prior to sx.  MRCP negative for CBD obstruction. Evidence of cholecystitis.  IV fluids.  Continue with Zosyn.  LFT stable.   Hypertension continue with Cardizem.   Alcoholism:  patient states he quit drinking one half weeks ago.; Continue  CIWA protocol.  No evidence of DT.   Hypomagnesia; received IV mg 7-11. Repeat labs today. Leukocytosis; likely secondary to cholecystitis. On IV zosyn.  Hyponatremia; IV fluids.   Code Status: Full Code.  Family Communication: care discussed with patient.  Disposition Plan: Remain in patient. Cholecystectomy today   Consultants:  Surgery  Procedures: [place]; Cholelithiasis and sludge in the gallbladder. Diffuse fatty  infiltration of the liver.  Antibiotics:  Zosyn  HPI/Subjective: Complaining of RUQ abdominal pain.  Appears calm, in no distress.  He denies chest pain and dyspnea.   Objective: Filed Vitals:   03/16/15 0611  BP: 162/78  Pulse: 90  Temp: 98.5 F (36.9 C)  Resp: 18    Intake/Output Summary (Last 24 hours) at 03/16/15 0857 Last data filed at 03/15/15 1403  Gross per 24 hour  Intake      0 ml  Output      0 ml  Net      0 ml   Filed Weights   03/14/15 1622 03/15/15 0335  Weight: 85.866 kg (189 lb 4.8  oz) 84.1 kg (185 lb 6.5 oz)    Exam:   General:  Alert, in no distress.   Cardiovascular: S 1, S 2 RRR  Respiratory: CTA  Abdomen: BS present, soft, mild tenderness  Musculoskeletal: no edema  Data Reviewed: Basic Metabolic Panel:  Recent Labs Lab 03/14/15 1630 03/15/15 0446 03/16/15 0349  NA 136 136 133*  K 3.7 3.5 4.2  CL 100* 101 102  CO2 24 25 24   GLUCOSE 128* 141* 156*  BUN 9 9 9   CREATININE 1.05 1.01 1.01  CALCIUM 9.8 9.2 8.7*  MG  --  1.6*  --   PHOS  --  3.0  --    Liver Function Tests:  Recent Labs Lab 03/14/15 1630 03/15/15 0446 03/16/15 0349  AST 92* 87* 63*  ALT 66* 63 60  ALKPHOS 103 92 89  BILITOT 2.3* 3.3* 3.0*  PROT 7.1 6.9 6.6  ALBUMIN 3.8 3.7 3.0*    Recent Labs Lab 03/14/15 1630  LIPASE 16*   No results for input(s): AMMONIA in the last 168 hours. CBC:  Recent Labs Lab 03/14/15 1630 03/15/15 0446 03/16/15 0349  WBC 7.2 13.3* 17.9*  NEUTROABS 3.9  --   --   HGB 16.1 15.5 14.4  HCT 46.4 44.6 41.4  MCV 101.8* 100.5* 99.8  PLT 268 253 218   Cardiac Enzymes:  Recent Labs Lab 03/14/15 1918  TROPONINI <0.03   BNP (last 3 results) No results for input(s): BNP in the last 8760 hours.  ProBNP (last 3 results) No results for input(s): PROBNP in  the last 8760 hours.  CBG: No results for input(s): GLUCAP in the last 168 hours.  Recent Results (from the past 240 hour(s))  Surgical pcr screen     Status: None   Collection Time: 03/16/15  2:36 AM  Result Value Ref Range Status   MRSA, PCR NEGATIVE NEGATIVE Final   Staphylococcus aureus NEGATIVE NEGATIVE Final    Comment:        The Xpert SA Assay (FDA approved for NASAL specimens in patients over 27 years of age), is one component of a comprehensive surveillance program.  Test performance has been validated by [facility] for patients greater than or equal to 53 year old. It is not intended to diagnose infection nor to guide or monitor treatment.       Studies: Ct Abdomen Pelvis W Contrast  03/15/2015   CLINICAL DATA:  Right upper quadrant pain and vomiting. History of alcohol abuse and pancreatitis.  EXAM: CT ABDOMEN AND PELVIS WITH CONTRAST  TECHNIQUE: Multidetector CT imaging of the abdomen and pelvis was performed using the standard protocol following bolus administration of intravenous contrast.  CONTRAST:  168mL OMNIPAQUE IOHEXOL 300 MG/ML  SOLN  COMPARISON:  Ultrasound abdomen 03/14/2015. CT abdomen and pelvis 08/14/2014.  FINDINGS: Atelectasis in the lung bases.  Small esophageal hiatal hernia.  Diffuse fatty infiltration of the liver. Gallbladder is mildly distended with mild wall thickening. Cholelithiasis. No bile duct dilatation. Pancreas, spleen, adrenal glands, abdominal aorta, inferior vena cava, and retroperitoneal lymph nodes are unremarkable. Low-attenuation lesions in the kidneys likely representing cysts. Largest on the right is 2.5 cm in diameter. No solid mass or hydronephrosis in either kidney. The stomach, small bowel, and colon are not abnormally distended. No free air or free fluid in the abdomen.  Pelvis: Prostate gland is not enlarged. Bladder wall is not thickened. No free or loculated pelvic fluid collections. No pelvic mass or lymphadenopathy. Appendix is surgically absent. No evidence of diverticulitis. Degenerative changes in the spine. No destructive bone lesions. Old right rib fractures.  IMPRESSION: Diffuse fatty infiltration of the liver. Gallbladder is mildly distended with suggestion of mild wall thickening. Cholelithiasis. No pericholecystic infiltration but cholecystitis is not excluded. Pancreas is normal.   Electronically Signed   By: Lucienne Capers M.D.   On: 03/15/2015 03:37   Mr 3d Recon At Scanner  03/15/2015   CLINICAL DATA:  Abdominal pain and biliary colic.  EXAM: MRI ABDOMEN WITHOUT CONTRAST  (INCLUDING MRCP)  TECHNIQUE: Multiplanar multisequence MR imaging of the abdomen was performed. Heavily  T2-weighted images of the biliary and pancreatic ducts were obtained, and three-dimensional MRCP images were rendered by post processing.  COMPARISON:  03/15/2015  FINDINGS: Significantly diminished exam detail secondary to motion artifact. Patient nauseous during exam. Patient was removed from scanner multiple times.  Lower chest:  No pleural or pericardial effusion.  Hepatobiliary: Diffuse fatty infiltration of the liver. No enhancing liver abnormality identified. Stones within the dependent portion of the gallbladder measure up to 7 mm. There is gallbladder wall thickening and edema identified. No common bile duct dilatation or intrahepatic duct dilatation. Mild periportal edema noted.  Pancreas: Normal appearance of the pancreas  Spleen: The spleen is enlarged measuring 14 cm in length. There is no focal splenic abnormality.  Adrenals/Urinary Tract: The adrenal glands are within normal limits. Normal appearance of the left kidney. There is a cyst within the right kidney measuring 2.1 cm. No focal left kidney abnormality.  Stomach/Bowel: The he stomach is normal. The small bowel  loops have a normal course and caliber without evidence for bowel obstruction. Normal signal from within the bone marrow.  Vascular/Lymphatic: Normal appearance of the abdominal aorta. No enlarged upper abdominal lymph nodes identified.  Other: There is a small amount of fluid identified extending around the inferior margin of the right lobe of liver.  Musculoskeletal: Normal signal is identified from within the bone marrow.  IMPRESSION: 1. No acute findings. 2. Gallstones, gallbladder wall thickening and edema consistent with cholecystitis. 3. No evidence for common bile duct stone or biliary obstruction. 4. Hepatic steatosis.   Electronically Signed   By: Kerby Moors M.D.   On: 03/15/2015 15:34   Mr Lambert Mody Cm/mrcp  03/15/2015   CLINICAL DATA:  Abdominal pain and biliary colic.  EXAM: MRI ABDOMEN WITHOUT CONTRAST  (INCLUDING MRCP)   TECHNIQUE: Multiplanar multisequence MR imaging of the abdomen was performed. Heavily T2-weighted images of the biliary and pancreatic ducts were obtained, and three-dimensional MRCP images were rendered by post processing.  COMPARISON:  03/15/2015  FINDINGS: Significantly diminished exam detail secondary to motion artifact. Patient nauseous during exam. Patient was removed from scanner multiple times.  Lower chest:  No pleural or pericardial effusion.  Hepatobiliary: Diffuse fatty infiltration of the liver. No enhancing liver abnormality identified. Stones within the dependent portion of the gallbladder measure up to 7 mm. There is gallbladder wall thickening and edema identified. No common bile duct dilatation or intrahepatic duct dilatation. Mild periportal edema noted.  Pancreas: Normal appearance of the pancreas  Spleen: The spleen is enlarged measuring 14 cm in length. There is no focal splenic abnormality.  Adrenals/Urinary Tract: The adrenal glands are within normal limits. Normal appearance of the left kidney. There is a cyst within the right kidney measuring 2.1 cm. No focal left kidney abnormality.  Stomach/Bowel: The he stomach is normal. The small bowel loops have a normal course and caliber without evidence for bowel obstruction. Normal signal from within the bone marrow.  Vascular/Lymphatic: Normal appearance of the abdominal aorta. No enlarged upper abdominal lymph nodes identified.  Other: There is a small amount of fluid identified extending around the inferior margin of the right lobe of liver.  Musculoskeletal: Normal signal is identified from within the bone marrow.  IMPRESSION: 1. No acute findings. 2. Gallstones, gallbladder wall thickening and edema consistent with cholecystitis. 3. No evidence for common bile duct stone or biliary obstruction. 4. Hepatic steatosis.   Electronically Signed   By: Kerby Moors M.D.   On: 03/15/2015 15:34   US Abdomen Limited Ruq  03/14/2015   CLINICAL  DATA:  Right upper quadrant pain. Prior appendectomy. Hypertension.  EXAM: US ABDOMEN LIMITED - RIGHT UPPER QUADRANT  COMPARISON:  CT abdomen and pelvis 08/14/2014. Ultrasound abdomen 02/12/2014.  FINDINGS: Gallbladder:  Sludge and stones are demonstrated in the dependent portion of the gallbladder. No gallbladder wall thickening. Murphy's sign is negative.  Common bile duct:  Diameter: 5.9 mm, normal  Liver:  Diffusely increased hepatic parenchymal echotexture are most likely to represent fatty infiltration. No discrete focal lesions identified.  IMPRESSION: Cholelithiasis and sludge in the gallbladder. Diffuse fatty infiltration of the liver.   Electronically Signed   By: Lucienne Capers M.D.   On: 03/14/2015 21:56    Scheduled Meds: . allopurinol  100 mg Oral Daily  . diltiazem  120 mg Oral Daily  . folic acid  1 mg Oral Daily  . multivitamin with minerals  1 tablet Oral Daily  . pantoprazole (PROTONIX) IV  40  mg Intravenous QHS  . piperacillin-tazobactam (ZOSYN)  IV  3.375 g Intravenous 3 times per day  . sodium chloride  3 mL Intravenous Q12H  . thiamine  100 mg Oral Daily   Or  . thiamine  100 mg Intravenous Daily  . zolpidem  5 mg Oral QHS   Continuous Infusions: . dextrose 5 % and 0.9% NaCl 75 mL/hr at 03/16/15 9935    Active Problems:   Hypertension   Alcoholism   RUQ pain   RUQ abdominal pain   Hyperbilirubinemia    Time spent: 35 minutes.     Niel Hummer A  [facility] Pager [phone]. If 7PM-7AM, please contact night-coverage at [email or web address], password [facility] 03/16/2015, 8:57 AM  LOS: 1 day

## 2015-03-16 NOTE — Anesthesia Postprocedure Evaluation (Signed)
Anesthesia Post Note  Patient: Brian Villegas  Procedure(s) Performed: Procedure(s) (LRB): LAPAROSCOPIC CHOLECYSTECTOMY (N/A)  Anesthesia type: General  Patient location: PACU  Post pain: Pain level controlled  Post assessment: Post-op Vital signs reviewed  Last Vitals: BP 134/71 mmHg  Pulse 91  Temp(Src) 37.5 C (Oral)  Resp 24  Ht 6\' 2"  (1.88 m)  Wt 185 lb 6.5 oz (84.1 kg)  BMI 23.79 kg/m2  SpO2 96%  Post vital signs: Reviewed  Level of consciousness: sedated  Complications: No apparent anesthesia complications

## 2015-03-16 NOTE — Op Note (Signed)
LAPAROSCOPIC CHOLECYSTECTOMY  Procedure Note  Brian Villegas 03/14/2015 - 03/16/2015   Pre-op Diagnosis: CHOLECYSTITIS     Post-op Diagnosis: acute gangrenous cholecystitis  Procedure(s): LAPAROSCOPIC CHOLECYSTECTOMY  Surgeon(s): Coralie Keens, MD  Anesthesia: General  Staff:  Circulator: Milas Kocher, RN Scrub Person: Beryle Lathe, RN; Norman Circulator Assistant: Beryle Lathe, RN  Estimated Blood Loss: Minimal               Specimens: sent to path          [facility] A   Date: 03/16/2015  Time: 12:00 PM

## 2015-03-16 NOTE — Progress Notes (Signed)
Informed Dr Lissa Hoard that pt states he had 1/2 can of sprite with morning pills around 0600

## 2015-03-16 NOTE — Anesthesia Procedure Notes (Signed)
Procedure Name: Intubation Date/Time: 03/16/2015 11:22 AM Performed by: Ollen Bowl Pre-anesthesia Checklist: Patient identified, Timeout performed, Emergency Drugs available, Suction available and Patient being monitored Patient Re-evaluated:Patient Re-evaluated prior to inductionOxygen Delivery Method: Circle system utilized and Simple face mask Preoxygenation: Pre-oxygenation with 100% oxygen Intubation Type: IV induction, Cricoid Pressure applied and Rapid sequence Ventilation: Mask ventilation without difficulty Laryngoscope Size: Miller and 3 Grade View: Grade I Tube type: Oral Tube size: 7.5 mm Number of attempts: 1 Airway Equipment and Method: Patient positioned with wedge pillow and Stylet Placement Confirmation: ETT inserted through vocal cords under direct vision,  positive ETCO2 and breath sounds checked- equal and bilateral Secured at: 22 cm Tube secured with: Tape Dental Injury: Teeth and Oropharynx as per pre-operative assessment

## 2015-03-16 NOTE — Anesthesia Preprocedure Evaluation (Addendum)
Anesthesia Evaluation  Patient identified by MRN, date of birth, ID band Patient awake    Reviewed: Allergy & Precautions, NPO status , Patient's Chart, lab work & pertinent test results  Airway Mallampati: II  TM Distance: >3 FB Neck ROM: Full    Dental no notable dental hx.    Pulmonary neg pulmonary ROS, shortness of breath and with exertion,  breath sounds clear to auscultation  Pulmonary exam normal       Cardiovascular hypertension, Pt. on medications + Peripheral Vascular Disease Normal cardiovascular examRhythm:Regular Rate:Normal  Echo 10/2012 - Left ventricle: The cavity size was normal. There was mildfocal basal hypertrophy of the septum. Systolic functionwas normal. The estimated ejection fraction was in therange of 55% to 60%. Wall motion was normal; there were noregional wall motion abnormalities. Doppler parameters areconsistent with abnormal left ventricular relaxation(grade 1 diastolic dysfunction). - Left atrium: The atrium was mildly dilated.    Neuro/Psych  Headaches, PSYCHIATRIC DISORDERS Anxiety negative psych ROS   GI/Hepatic GERD-  ,(+) Hepatitis -, Unspecified  Endo/Other  negative endocrine ROS  Renal/GU negative Renal ROS     Musculoskeletal negative musculoskeletal ROS (+)   Abdominal   Peds  Hematology negative hematology ROS (+)   Anesthesia Other Findings   Reproductive/Obstetrics                            Anesthesia Physical Anesthesia Plan  ASA: II  Anesthesia Plan: General   Post-op Pain Management:    Induction: Intravenous  Airway Management Planned: Oral ETT  Additional Equipment:   Intra-op Plan:   Post-operative Plan: Extubation in OR  Informed Consent: I have reviewed the patients History and Physical, chart, labs and discussed the procedure including the risks, benefits and alternatives for the proposed anesthesia with the patient or  authorized representative who has indicated his/her understanding and acceptance.   Dental advisory given  Plan Discussed with: CRNA  Anesthesia Plan Comments:         Anesthesia Quick Evaluation

## 2015-03-16 NOTE — Transfer of Care (Signed)
Immediate Anesthesia Transfer of Care Note  Patient: Brian Villegas  Procedure(s) Performed: Procedure(s): LAPAROSCOPIC CHOLECYSTECTOMY (N/A)  Patient Location: PACU  Anesthesia Type:General  Level of Consciousness: awake, alert  and oriented  Airway & Oxygen Therapy: Patient Spontanous Breathing and Patient connected to face mask oxygen  Post-op Assessment: Report given to RN, Post -op Vital signs reviewed and stable and Patient moving all extremities X 4  Post vital signs: Reviewed and stable  Last Vitals:  Filed Vitals:   03/16/15 1215  BP:   Pulse:   Temp: 36.6 C  Resp:     Complications: No apparent anesthesia complications

## 2015-03-17 ENCOUNTER — Encounter (HOSPITAL_COMMUNITY): Payer: Self-pay | Admitting: Surgery

## 2015-03-17 DIAGNOSIS — F1020 Alcohol dependence, uncomplicated: Secondary | ICD-10-CM

## 2015-03-17 DIAGNOSIS — I10 Essential (primary) hypertension: Secondary | ICD-10-CM

## 2015-03-17 DIAGNOSIS — K8000 Calculus of gallbladder with acute cholecystitis without obstruction: Principal | ICD-10-CM

## 2015-03-17 LAB — COMPREHENSIVE METABOLIC PANEL
ALK PHOS: 79 U/L (ref 38–126)
ALT: 40 U/L (ref 17–63)
ANION GAP: 9 (ref 5–15)
AST: 36 U/L (ref 15–41)
Albumin: 2.5 g/dL — ABNORMAL LOW (ref 3.5–5.0)
BUN: 13 mg/dL (ref 6–20)
CALCIUM: 8.1 mg/dL — AB (ref 8.9–10.3)
CHLORIDE: 101 mmol/L (ref 101–111)
CO2: 22 mmol/L (ref 22–32)
Creatinine, Ser: 0.96 mg/dL (ref 0.61–1.24)
GFR calc non Af Amer: >60 mL/min (ref >60)
Glucose, Bld: 128 mg/dL — ABNORMAL HIGH (ref 65–99)
Potassium: 3.7 mmol/L (ref 3.5–5.1)
SODIUM: 132 mmol/L — AB (ref 135–145)
Total Bilirubin: 2.2 mg/dL — ABNORMAL HIGH (ref 0.3–1.2)
Total Protein: 5.5 g/dL — ABNORMAL LOW (ref 6.5–8.1)

## 2015-03-17 LAB — CBC
HCT: 38.9 % — ABNORMAL LOW (ref 39.0–52.0)
Hemoglobin: 13.5 g/dL (ref 13.0–17.0)
MCH: 34.4 pg — ABNORMAL HIGH (ref 26.0–34.0)
MCHC: 34.7 g/dL (ref 30.0–36.0)
MCV: 99.2 fL (ref 78.0–100.0)
Platelets: 179 10*3/uL (ref 150–400)
RBC: 3.92 MIL/uL — AB (ref 4.22–5.81)
RDW: 17.3 % — ABNORMAL HIGH (ref 11.5–15.5)
WBC: 13.9 10*3/uL — ABNORMAL HIGH (ref 4.0–10.5)

## 2015-03-17 MED ORDER — BUTALBITAL-APAP-CAFFEINE 50-325-40 MG PO TABS
1.0000 | ORAL_TABLET | ORAL | Status: DC | PRN
Start: 2015-03-17 — End: 2015-03-18
  Administered 2015-03-17: 1 via ORAL
  Filled 2015-03-17: qty 1

## 2015-03-17 MED ORDER — MORPHINE SULFATE 2 MG/ML IJ SOLN: 2.0000 mg | INTRAMUSCULAR | Status: DC | PRN

## 2015-03-17 MED ORDER — CIPROFLOXACIN HCL 500 MG PO TABS
500.0000 mg | ORAL_TABLET | Freq: Two times a day (BID) | ORAL | Status: DC
  Administered 2015-03-17 – 2015-03-18 (×3): 500 mg via ORAL
  Filled 2015-03-17 (×3): qty 1

## 2015-03-17 MED ORDER — PANTOPRAZOLE SODIUM 40 MG PO TBEC
40.0000 mg | DELAYED_RELEASE_TABLET | Freq: Every day | ORAL | Status: DC
  Administered 2015-03-17 (×2): 40 mg via ORAL
  Filled 2015-03-17 (×2): qty 1

## 2015-03-17 MED ORDER — FENTANYL 25 MCG/HR TD PT72
25.0000 ug | MEDICATED_PATCH | TRANSDERMAL | Status: DC
  Administered 2015-03-17: 25 ug via TRANSDERMAL
  Filled 2015-03-17: qty 1

## 2015-03-17 NOTE — Care Management Important Message (Signed)
Important Message  Patient Details  Name: Brian Villegas MRN: [id number] Date of Birth: [birth year]   Medicare Important Message Given:  Yes-second notification given    Delorse Lek 03/17/2015, 11:46 AM

## 2015-03-17 NOTE — Progress Notes (Signed)
Patient ID: Brian Villegas, male   DOB: 1946-05-08, 69 y.o.   MRN: [id number]   LOS: 2 days   POD#1  Subjective: Very talkative, doing well except for pain. Controlled with IV meds but resistant to trying anything else 2/2 N/V. Doesn't feel well enough to go home today 2/2 pain.   Objective: Vital signs in last 24 hours: Temp:  [97.9 F (36.6 C)-99.5 F (37.5 C)] 98.4 F (36.9 C) (07/13 0514) Pulse Rate:  [82-111] 82 (07/13 0514) Resp:  [18-25] 20 (07/13 0514) BP: (124-157)/(66-95) 134/66 mmHg (07/13 0514) SpO2:  [93 %-100 %] 96 % (07/13 0514) Last BM Date: 03/13/15   Laboratory  CBC  Recent Labs  03/16/15 0349 03/17/15 0313  WBC 17.9* 13.9*  HGB 14.4 13.5  HCT 41.4 38.9*  PLT 218 179   BMET  Recent Labs  03/16/15 0349 03/17/15 0313  NA 133* 132*  K 4.2 3.7  CL 102 101  CO2 24 22  GLUCOSE 156* 128*  BUN 9 13  CREATININE 1.01 0.96  CALCIUM 8.7* 8.1*    Physical Exam General appearance: alert and no distress Resp: clear to auscultation bilaterally Cardio: regular rate and rhythm GI: normal findings: bowel sounds normal and soft, appropriately TTP, port sites WNL   Assessment/Plan: Acute cholecystitis -- Convince pt to try Duragesic + Fioricet for pain. Ok to discharge from [place] standpoint on 1 week of abx once pain controlled.    Lisette Abu, PA-C Pager: [phone] 03/17/2015

## 2015-03-17 NOTE — Op Note (Signed)
Brian Villegas, Brian Villegas NO.:  [id number]  MEDICAL RECORD NO.:  [id number]  LOCATION:  [id number]                        FACILITY:  [facility]  PHYSICIAN:  Coralie Keens, M.D. DATE OF BIRTH:  1946-06-14  DATE OF PROCEDURE:  03/16/2015 DATE OF DISCHARGE:                              OPERATIVE REPORT   PREOPERATIVE DIAGNOSIS:  Acute cholecystitis.  POSTOPERATIVE DIAGNOSIS:  Acute gangrenous cholecystitis.  PROCEDURE:  Laparoscopic cholecystectomy.  SURGEON:  Coralie Keens, M.D.  ANESTHESIA:  General and 0.5% Marcaine.  ESTIMATED BLOOD LOSS:  Minimal.  FINDINGS:  The patient was found to have an acutely inflamed gallbladder with gangrenous changes.  The liver was fatty, but did not appear cirrhotic.  I could not perform the cholangiogram as the gallbladder was quite necrotic and I was afraid the cystic duct would tear.  PROCEDURE IN DETAIL:  The patient was brought to the operating room, identified as Brian Villegas.  He was placed supine on the operating table and general anesthesia was induced.  His abdomen was then prepped and draped in usual sterile fashion.  I made a small vertical incision below the umbilicus.  I carried this down to the fascia, which was then opened with a scalpel.  A hemostat was used to pass the peritoneal cavity under direct vision.  A 0 Vicryl pursestring suture was then placed around the fascial opening.  The Hasson port was placed through the opening and insufflation of the abdomen was begun.  I placed a 5-mm port in the patient's epigastrium and two more in the right upper quadrant, all under direct vision.  I pulled the omentum down off the gallbladder. The gallbladder was acutely inflamed with gangrenous changes.  I had to aspirate bile from the gallbladder in order to facilitate grasping the gallbladder.  Once this was done, I was able to grasp it and retracted above the liver bed.  The base of the gallbladder was very necrotic.   I was able to dissect out the cystic duct and achieved a critical window around it.  I decided to forego a cholangiogram as the tissue was quite necrotic.  I was able to clipped it three times proximally, once distally, and transected.  The cystic artery and posterior branch were then identified, clipped proximally, distally, and transected as well. The gallbladder was then slowly dissected free from the liver bed with electrocautery.  Once this free from liver bed, it was placed in an Endosac and removed through the incision at the umbilicus.  I then achieved hemostasis in the liver bed with the cautery.  I then irrigated the abdomen copiously with saline.  Hemostasis was again appeared to be achieved.  All ports were removed under direct vision.  The abdomen was deflated.  The 0 Vicryl at the umbilicus was tied in place closing the fascial defect.  All incisions were then anesthetized with Marcaine and closed with 4-0 Monocryl subcuticular sutures.  Skin glue was then applied.  The patient tolerated the procedure well.  All the counts were correct at the end of the procedure.  The patient was then extubated in the operating room and taken in a stable condition to the recovery  room.     Coralie Keens, M.D.     DB/MEDQ  D:  03/16/2015  T:  03/17/2015  Job:  [id number]

## 2015-03-17 NOTE — Progress Notes (Signed)
[facility] PROGRESS NOTE  Brian Villegas HER:[id number] DOB: [birth year] DOA: 03/14/2015  PCP: Pcp Not In System  Brief HPI: 69 year old Caucasian male with a past medical history of memory impairment, hypertension, polycythemia, presented with one-week history of right upper quadrant abdominal pain radiating to the back. He was found to have elevated LFTs. Cholecystitis was suspected. Patient was hospitalized for further management.  Past medical history:  Past Medical History  Diagnosis Date  . Hypertension     multiple medication intolerances  . Polycythemia     requires phlebotomies   . Alcohol consuption of more than two drinks per day   . Memory loss     "since fall w/concussion in 06/2013" (02/12/2014)  . Concussion with loss of consciousness 06/18/2013  . GERD (gastroesophageal reflux disease)   . Constipation   . Basal cell carcinoma ~ 2012    "MOHS on right neck"  . High cholesterol     "creeping up; not taking RX yet" (02/12/2014)  . Hepatitis 1952    "yellow jaundice"  . Headache(784.0)     "5d X wk since fall w/concussion in 06/2013" (02/12/2014)  . Anxiety   . Pancreatitis     Consultants: Gen. surgery  Procedures: Laparoscopic cholecystectomy 7/12  Antibiotics: Zosyn until 7/13 Oral ciprofloxacin. 7/13.  Subjective: Patient feels better but continues to have some pain in the upper abdomen. Denies any nausea, vomiting. Has not been up and walking yet.  Objective: Vital Signs  Filed Vitals:   03/16/15 2254 03/17/15 0137 03/17/15 0514 03/17/15 1058  BP: 129/72 128/93 134/66 142/76  Pulse: 85 91 82 78  Temp: 99.1 F (37.3 C) 98.5 F (36.9 C) 98.4 F (36.9 C) 98.6 F (37 C)  TempSrc: Oral Oral Oral Oral  Resp: 18 21 20 20   Height:      Weight:      SpO2: 93% 96% 96% 95%    Intake/Output Summary (Last 24 hours) at 03/17/15 1126 Last data filed at 03/17/15 0900  Gross per 24 hour  Intake   2337 ml  Output    375 ml  Net   1962 ml    Filed Weights   03/14/15 1622 03/15/15 0335  Weight: 85.866 kg (189 lb 4.8 oz) 84.1 kg (185 lb 6.5 oz)    General appearance: alert, cooperative, appears stated age and no distress Resp: clear to auscultation bilaterally Cardio: regular rate and rhythm, S1, S2 normal, no murmur, click, rub or gallop GI: Soft. Slightly tender in the upper abdomen in the epigastric area as well as right upper quadrant. No rebound, rigidity or guarding. No masses or organomegaly. Bowel sounds present. Extremities: extremities normal, atraumatic, no cyanosis or edema Neurologic: No focal deficits  Lab Results:  Basic Metabolic Panel:  Recent Labs Lab 03/14/15 1630 03/15/15 0446 03/16/15 0349 03/16/15 0945 03/17/15 0313  NA 136 136 133*  --  132*  K 3.7 3.5 4.2  --  3.7  CL 100* 101 102  --  101  CO2 24 25 24   --  22  GLUCOSE 128* 141* 156*  --  128*  BUN 9 9 9   --  13  CREATININE 1.05 1.01 1.01  --  0.96  CALCIUM 9.8 9.2 8.7*  --  8.1*  MG  --  1.6*  --  2.0  --   PHOS  --  3.0  --   --   --    Liver Function Tests:  Recent Labs Lab 03/14/15 1630 03/15/15 0446  03/16/15 0349 03/17/15 0313  AST 92* 87* 63* 36  ALT 66* 63 60 40  ALKPHOS 103 92 89 79  BILITOT 2.3* 3.3* 3.0* 2.2*  PROT 7.1 6.9 6.6 5.5*  ALBUMIN 3.8 3.7 3.0* 2.5*    Recent Labs Lab 03/14/15 1630  LIPASE 16*   CBC:  Recent Labs Lab 03/14/15 1630 03/15/15 0446 03/16/15 0349 03/17/15 0313  WBC 7.2 13.3* 17.9* 13.9*  NEUTROABS 3.9  --   --   --   HGB 16.1 15.5 14.4 13.5  HCT 46.4 44.6 41.4 38.9*  MCV 101.8* 100.5* 99.8 99.2  PLT 268 253 218 179   Cardiac Enzymes:  Recent Labs Lab 03/14/15 1918  TROPONINI <0.03     Recent Results (from the past 240 hour(s))  Surgical pcr screen     Status: None   Collection Time: 03/16/15  2:36 AM  Result Value Ref Range Status   MRSA, PCR NEGATIVE NEGATIVE Final   Staphylococcus aureus NEGATIVE NEGATIVE Final    Comment:        The Xpert SA Assay  (FDA approved for NASAL specimens in patients over 62 years of age), is one component of a comprehensive surveillance program.  Test performance has been validated by [facility] for patients greater than or equal to 2 year old. It is not intended to diagnose infection nor to guide or monitor treatment.       Studies/Results: Mr 3d Recon At Scanner  03/15/2015   CLINICAL DATA:  Abdominal pain and biliary colic.  EXAM: MRI ABDOMEN WITHOUT CONTRAST  (INCLUDING MRCP)  TECHNIQUE: Multiplanar multisequence MR imaging of the abdomen was performed. Heavily T2-weighted images of the biliary and pancreatic ducts were obtained, and three-dimensional MRCP images were rendered by post processing.  COMPARISON:  03/15/2015  FINDINGS: Significantly diminished exam detail secondary to motion artifact. Patient nauseous during exam. Patient was removed from scanner multiple times.  Lower chest:  No pleural or pericardial effusion.  Hepatobiliary: Diffuse fatty infiltration of the liver. No enhancing liver abnormality identified. Stones within the dependent portion of the gallbladder measure up to 7 mm. There is gallbladder wall thickening and edema identified. No common bile duct dilatation or intrahepatic duct dilatation. Mild periportal edema noted.  Pancreas: Normal appearance of the pancreas  Spleen: The spleen is enlarged measuring 14 cm in length. There is no focal splenic abnormality.  Adrenals/Urinary Tract: The adrenal glands are within normal limits. Normal appearance of the left kidney. There is a cyst within the right kidney measuring 2.1 cm. No focal left kidney abnormality.  Stomach/Bowel: The he stomach is normal. The small bowel loops have a normal course and caliber without evidence for bowel obstruction. Normal signal from within the bone marrow.  Vascular/Lymphatic: Normal appearance of the abdominal aorta. No enlarged upper abdominal lymph nodes identified.  Other: There is a small amount of  fluid identified extending around the inferior margin of the right lobe of liver.  Musculoskeletal: Normal signal is identified from within the bone marrow.  IMPRESSION: 1. No acute findings. 2. Gallstones, gallbladder wall thickening and edema consistent with cholecystitis. 3. No evidence for common bile duct stone or biliary obstruction. 4. Hepatic steatosis.   Electronically Signed   By: Kerby Moors M.D.   On: 03/15/2015 15:34   Mr Lambert Mody Cm/mrcp  03/15/2015   CLINICAL DATA:  Abdominal pain and biliary colic.  EXAM: MRI ABDOMEN WITHOUT CONTRAST  (INCLUDING MRCP)  TECHNIQUE: Multiplanar multisequence MR imaging of the abdomen was performed.  Heavily T2-weighted images of the biliary and pancreatic ducts were obtained, and three-dimensional MRCP images were rendered by post processing.  COMPARISON:  03/15/2015  FINDINGS: Significantly diminished exam detail secondary to motion artifact. Patient nauseous during exam. Patient was removed from scanner multiple times.  Lower chest:  No pleural or pericardial effusion.  Hepatobiliary: Diffuse fatty infiltration of the liver. No enhancing liver abnormality identified. Stones within the dependent portion of the gallbladder measure up to 7 mm. There is gallbladder wall thickening and edema identified. No common bile duct dilatation or intrahepatic duct dilatation. Mild periportal edema noted.  Pancreas: Normal appearance of the pancreas  Spleen: The spleen is enlarged measuring 14 cm in length. There is no focal splenic abnormality.  Adrenals/Urinary Tract: The adrenal glands are within normal limits. Normal appearance of the left kidney. There is a cyst within the right kidney measuring 2.1 cm. No focal left kidney abnormality.  Stomach/Bowel: The he stomach is normal. The small bowel loops have a normal course and caliber without evidence for bowel obstruction. Normal signal from within the bone marrow.  Vascular/Lymphatic: Normal appearance of the abdominal aorta.  No enlarged upper abdominal lymph nodes identified.  Other: There is a small amount of fluid identified extending around the inferior margin of the right lobe of liver.  Musculoskeletal: Normal signal is identified from within the bone marrow.  IMPRESSION: 1. No acute findings. 2. Gallstones, gallbladder wall thickening and edema consistent with cholecystitis. 3. No evidence for common bile duct stone or biliary obstruction. 4. Hepatic steatosis.   Electronically Signed   By: Kerby Moors M.D.   On: 03/15/2015 15:34    Medications:  Scheduled: . allopurinol  100 mg Oral Daily  . ciprofloxacin  500 mg Oral BID  . diltiazem  120 mg Oral Daily  . fentaNYL  25 mcg Transdermal Q72H  . folic acid  1 mg Oral Daily  . multivitamin with minerals  1 tablet Oral Daily  . pantoprazole  40 mg Oral QHS  . sodium chloride  3 mL Intravenous Q12H  . thiamine  100 mg Oral Daily   Or  . thiamine  100 mg Intravenous Daily  . zolpidem  5 mg Oral QHS   Continuous: . dextrose 5 % and 0.9% NaCl Stopped (03/17/15 1412)  . lactated ringers 10 mL/hr at 03/16/15 1009   FIE:PPIRJJOACZYSA **OR** acetaminophen, butalbital-acetaminophen-caffeine, LORazepam **OR** LORazepam, morphine injection, ondansetron **OR** ondansetron (ZOFRAN) IV  Assessment/Plan:  Active Problems:   Hypertension   Alcoholism   RUQ pain   RUQ abdominal pain   Hyperbilirubinemia    Acute cholecystitis Patient was seen by general surgery. Ultrasound was nonspecific but did show cholelithiasis. Patient underwent MRI, which indeed suggested cholecystitis. Patient underwent laparoscopic cholecystectomy. No CBD stone identified on MRCP. Change to oral ciprofloxacin. Cutback on IV fluids. Advance diet. Mobilize. Patient is intolerant of multiple oral narcotics. Fentanyl patch has been initiated by general surgery.  History of essential hypertension Blood pressure is reasonably well controlled. Continue current medications.  History of  alcoholism Apparently quit 1-1/2 weeks ago. Continue CIWA protocol. Continue thiamine. No evidence for DTs.  Hypomagnesemia Status post correction  Mild hyponatremia Stable. Can be followed up further as an outpatient.  DVT Prophylaxis: SCDs    Code Status: Full code  Family Communication: Discussed with the patient and his wife  Disposition Plan: Anticipate discharge tomorrow.    LOS: 2 days   [facility] Pager [phone] 03/17/2015, 11:26 AM  If 7PM-7AM, please  contact night-coverage at [email or web address], password [facility]

## 2015-03-18 DIAGNOSIS — K810 Acute cholecystitis: Secondary | ICD-10-CM

## 2015-03-18 LAB — CBC
HCT: 35 % — ABNORMAL LOW (ref 39.0–52.0)
Hemoglobin: 12.2 g/dL — ABNORMAL LOW (ref 13.0–17.0)
MCH: 34.5 pg — ABNORMAL HIGH (ref 26.0–34.0)
MCHC: 34.9 g/dL (ref 30.0–36.0)
MCV: 98.9 fL (ref 78.0–100.0)
Platelets: 206 10*3/uL (ref 150–400)
RBC: 3.54 MIL/uL — ABNORMAL LOW (ref 4.22–5.81)
RDW: 16.8 % — ABNORMAL HIGH (ref 11.5–15.5)
WBC: 9.4 10*3/uL (ref 4.0–10.5)

## 2015-03-18 LAB — COMPREHENSIVE METABOLIC PANEL
ALBUMIN: 2.2 g/dL — AB (ref 3.5–5.0)
ALT: 29 U/L (ref 17–63)
AST: 26 U/L (ref 15–41)
Alkaline Phosphatase: 81 U/L (ref 38–126)
Anion gap: 7 (ref 5–15)
BILIRUBIN TOTAL: 2 mg/dL — AB (ref 0.3–1.2)
BUN: 11 mg/dL (ref 6–20)
CALCIUM: 8.3 mg/dL — AB (ref 8.9–10.3)
CO2: 24 mmol/L (ref 22–32)
CREATININE: 0.91 mg/dL (ref 0.61–1.24)
Chloride: 102 mmol/L (ref 101–111)
GFR calc Af Amer: >60 mL/min (ref >60)
Glucose, Bld: 93 mg/dL (ref 65–99)
POTASSIUM: 3.3 mmol/L — AB (ref 3.5–5.1)
SODIUM: 133 mmol/L — AB (ref 135–145)
Total Protein: 5.5 g/dL — ABNORMAL LOW (ref 6.5–8.1)

## 2015-03-18 MED ORDER — CIPROFLOXACIN HCL 500 MG PO TABS: 500.0000 mg | ORAL_TABLET | Freq: Two times a day (BID) | ORAL | Status: DC

## 2015-03-18 MED ORDER — FENTANYL 25 MCG/HR TD PT72: 25.0000 ug | MEDICATED_PATCH | TRANSDERMAL | Status: DC

## 2015-03-18 MED ORDER — BUTALBITAL-APAP-CAFFEINE 50-325-40 MG PO TABS: 1.0000 | ORAL_TABLET | ORAL | Status: DC | PRN

## 2015-03-18 MED ORDER — FENTANYL 25 MCG/HR TD PT72
25.0000 ug | MEDICATED_PATCH | TRANSDERMAL | Status: DC
Start: 2015-03-18 — End: 2015-05-14

## 2015-03-18 MED ORDER — ADULT MULTIVITAMIN W/MINERALS CH: 1.0000 | ORAL_TABLET | Freq: Every day | ORAL | Status: AC

## 2015-03-18 NOTE — Progress Notes (Signed)
DC home with wife, verbally understood DC instructions, no questions asked. 

## 2015-03-18 NOTE — Progress Notes (Signed)
Patient removed the telemetry monitor to take a shower and does not want the telemetry monitor back.  He had been dressed up, expecting to be discharged today and ready to go home.  CV strips for the past 2 days showed normal sinus rhythm. [facility] had been notified and will advise accordingly if telemetry monitor is put back on.

## 2015-03-18 NOTE — Discharge Instructions (Signed)
Your appointment is at 2:45pm, please arrive at least 30 min before your appointment to complete your check in paperwork.  If you are unable to arrive 30 min prior to your appointment time we may have to cancel or reschedule you.  LAPAROSCOPIC SURGERY: POST OP INSTRUCTIONS  1. DIET: Follow a light bland diet the first 24 hours after arrival home, such as soup, liquids, crackers, etc. Be sure to include lots of fluids daily. Avoid fast food or heavy meals as your are more likely to get nauseated. Eat a low fat the next few days after surgery.  2. Take your usually prescribed home medications unless otherwise directed. 3. PAIN CONTROL:  1. Pain is best controlled by a usual combination of three different methods TOGETHER:  1. Ice/Heat 2. Over the counter pain medication 3. Prescription pain medication 2. Most patients will experience some swelling and bruising around the incisions. Ice packs or heating pads (30-60 minutes up to 6 times a day) will help. Use ice for the first few days to help decrease swelling and bruising, then switch to heat to help relax tight/sore spots and speed recovery. Some people prefer to use ice alone, heat alone, alternating between ice & heat. Experiment to what works for you. Swelling and bruising can take several weeks to resolve.  3. It is helpful to take an over-the-counter pain medication regularly for the first few weeks. Choose one of the following that works best for you:  1. Naproxen (Aleve, etc) Two 220mg  tabs twice a day 2. Ibuprofen (Advil, etc) Three 200mg  tabs four times a day (every meal & bedtime) 3. Acetaminophen (Tylenol, etc) 500-650mg  four times a day (every meal & bedtime) 4. A prescription for pain medication (such as oxycodone, hydrocodone, etc) should be given to you upon discharge. Take your pain medication as prescribed.  1. If you are having problems/concerns with the prescription medicine (does not control pain, nausea, vomiting, rash, itching,  etc), please call us [phone] to see if we need to switch you to a different pain medicine that will work better for you and/or control your side effect better. 2. If you need a refill on your pain medication, please contact your pharmacy. They will contact our office to request authorization. Prescriptions will not be filled after 5 pm or on week-ends. 4. Avoid getting constipated. Between the surgery and the pain medications, it is common to experience some constipation. Increasing fluid intake and taking a fiber supplement (such as Metamucil, Citrucel, FiberCon, MiraLax, etc) 1-2 times a day regularly will usually help prevent this problem from occurring. A mild laxative (prune juice, Milk of Magnesia, MiraLax, etc) should be taken according to package directions if there are no bowel movements after 48 hours.  5. Watch out for diarrhea. If you have many loose bowel movements, simplify your diet to bland foods & liquids for a few days. Stop any stool softeners and decrease your fiber supplement. Switching to mild anti-diarrheal medications (Kayopectate, Pepto Bismol) can help. If this worsens or does not improve, please call us. 6. Wash / shower every day. You may shower over the dressings as they are waterproof. Continue to shower over incision(s) after the dressing is off. 7. Remove your waterproof bandages 5 days after surgery. You may leave the incision open to air. You may replace a dressing/Band-Aid to cover the incision for comfort if you wish.  8. ACTIVITIES as tolerated:  1. You may resume regular (light) daily activities beginning the next day--such as  walking, climbing stairs--gradually increasing activities as tolerated. If you can walk 30 minutes without difficulty, it is safe to try more intense activity such as jogging, treadmill, bicycling, low-impact aerobics, swimming, etc. °2. Save the most intensive and strenuous activity for last such as sit-ups, heavy lifting,  contact sports, etc Refrain from any heavy lifting or straining until you are off narcotics for pain control.  °3. DO NOT PUSH THROUGH PAIN. Let pain be your guide: If it hurts to do something, don't do it. Pain is your body warning you to avoid that activity for another week until the pain goes down. °4. You may drive when you are no longer taking prescription pain medication, you can comfortably wear a seatbelt, and you can safely maneuver your car and apply brakes. °5. You may have sexual intercourse when it is comfortable.  °9. FOLLOW UP in our office  °1. Please call [facility] at [phone] to set up an appointment to see your surgeon in the office for a follow-up appointment approximately 2-3 weeks after your surgery. °2. Make sure that you call for this appointment the day you arrive home to insure a convenient appointment time. °     10. IF YOU HAVE DISABILITY OR FAMILY LEAVE FORMS, BRING THEM TO THE               OFFICE FOR PROCESSING.  ° °WHEN TO CALL US [phone]:  °1. Poor pain control °2. Reactions / problems with new medications (rash/itching, nausea, etc)  °3. Fever over 101.5 F (38.5 C) °4. Inability to urinate °5. Nausea and/or vomiting °6. Worsening swelling or bruising °7. Continued bleeding from incision. °8. Increased pain, redness, or drainage from the incision ° °The clinic staff is available to answer your questions during regular business hours (8:30am-5pm). Please don’t hesitate to call and ask to speak to one of our nurses for clinical concerns.  °If you have a medical emergency, go to the nearest emergency room or call 911.  °A surgeon from [facility] is always on call at the hospitals  ° °[facility]  °[address], [address], [place], [place] [address] ?  °MAIN: [phone] ? TOLL FREE: [phone] ?  °FAX [phone]  °[email or web address] ° °

## 2015-03-18 NOTE — Progress Notes (Signed)
[facility] Progress Note  2 Days Post-Op  Subjective: Pt feels good, c/o being taken off IV pain medication, but tolerating pain well with patch.  No N/V, ambulating well.  Has clothes on and wants to leave immediately.  No N/V, tolerating diet well.  Having flatus, no BM yet.  Objective: Vital signs in last 24 hours: Temp:  [98.4 F (36.9 C)-99.3 F (37.4 C)] 98.4 F (36.9 C) (07/14 0522) Pulse Rate:  [78-88] 84 (07/14 0522) Resp:  [18-20] 18 (07/14 0522) BP: (134-151)/(69-81) 134/81 mmHg (07/14 0522) SpO2:  [94 %-96 %] 96 % (07/14 0522) Last BM Date: 03/13/15  Intake/Output from previous day: 07/13 0701 - 07/14 0700 In: 1164 [P.O.:1164] Out: -  Intake/Output this shift:    PE: Gen:  Alert, NAD, pleasant Abd: Soft, mild tenderness, ND, +BS, no HSM, incisions C/D/I   Lab Results:   Recent Labs  03/17/15 0313 03/18/15 0340  WBC 13.9* 9.4  HGB 13.5 12.2*  HCT 38.9* 35.0*  PLT 179 206   BMET  Recent Labs  03/17/15 0313 03/18/15 0340  NA 132* 133*  K 3.7 3.3*  CL 101 102  CO2 22 24  GLUCOSE 128* 93  BUN 13 11  CREATININE 0.96 0.91  CALCIUM 8.1* 8.3*   PT/INR  Recent Labs  03/16/15 0945  LABPROT 15.3*  INR 1.19   CMP     Component Value Date/Time   NA 133* 03/18/2015 0340   K 3.3* 03/18/2015 0340   CL 102 03/18/2015 0340   CO2 24 03/18/2015 0340   GLUCOSE 93 03/18/2015 0340   BUN 11 03/18/2015 0340   CREATININE 0.91 03/18/2015 0340   CALCIUM 8.3* 03/18/2015 0340   PROT 5.5* 03/18/2015 0340   ALBUMIN 2.2* 03/18/2015 0340   AST 26 03/18/2015 0340   ALT 29 03/18/2015 0340   ALKPHOS 81 03/18/2015 0340   BILITOT 2.0* 03/18/2015 0340   GFRNONAA >60 03/18/2015 0340   GFRAA >60 03/18/2015 0340   Lipase     Component Value Date/Time   LIPASE 16* 03/14/2015 1630       Studies/Results: No results found.  Anti-infectives: Anti-infectives    Start     Dose/Rate Route Frequency Ordered Stop   03/17/15 1200  ciprofloxacin  (CIPRO) tablet 500 mg     500 mg Oral 2 times daily 03/17/15 1127     03/15/15 0415  piperacillin-tazobactam (ZOSYN) IVPB 3.375 g  Status:  Discontinued     3.375 g 12.5 mL/hr over 240 Minutes Intravenous 3 times per day 03/15/15 0412 03/17/15 1127       Assessment/Plan Acute cholecystitis POD #2 s/p lap chole -- Pain better controlled.  Labs improving.  Ok to discharge from surgery standpoint on 1 week total of antibiotics - currently on cipro which he is tolerating    LOS: 3 days    Nat Christen 03/18/2015, 8:54 AM Pager: [phone]

## 2015-03-18 NOTE — Discharge Summary (Signed)
[facility]  Physician Discharge Summary   Patient ID: Brian Villegas MRN: [id number] DOB/AGE: Jun 26, 1946 69 y.o.  Admit date: 03/14/2015 Discharge date: 03/18/2015  DISCHARGE DIAGNOSES:  Active Problems:   Hypertension   Alcoholism   RUQ pain   RUQ abdominal pain   Hyperbilirubinemia   RECOMMENDATIONS FOR OUTPATIENT FOLLOW UP: 1. Out patient follow-up with general surgery 2. PCP to further address hyponatremia   DISCHARGE CONDITION: fair  Diet recommendation: Low-sodium  Filed Weights   03/14/15 1622 03/15/15 0335  Weight: 85.866 kg (189 lb 4.8 oz) 84.1 kg (185 lb 6.5 oz)    INITIAL HISTORY: 69 year old Caucasian male with a past medical history of memory impairment, hypertension, polycythemia, presented with one-week history of right upper quadrant abdominal pain radiating to the back. He was found to have elevated LFTs. Cholecystitis was suspected. Patient was hospitalized for further management.  Consultants: Gen. surgery  Procedures: Laparoscopic cholecystectomy 7/12  Antibiotics: Zosyn until 7/13 Oral ciprofloxacin. 7/13.   HOSPITAL COURSE:   Acute cholecystitis Patient was seen by general surgery. Ultrasound was nonspecific but did show cholelithiasis. Patient underwent MRI, which indeed suggested cholecystitis. Patient underwent laparoscopic cholecystectomy. No CBD stone identified on MRCP. He was initially placed on Zosyn, which was changed over to oral ciprofloxacin. His diet was advanced which he tolerated. Patient is intolerant of multiple oral narcotics. Fentanyl patch has been initiated by general surgery. Pain is reasonably well controlled.  History of essential hypertension Blood pressure is reasonably well controlled. Continue current medications.  History of alcoholism Apparently quit 1-1/2 weeks ago. Continue CIWA protocol. Continue thiamine. No evidence for DTs.  Hypomagnesemia Status post correction  Mild hyponatremia Stable. Can  be followed up further as an outpatient if it persists.  Overall improved. Patient very keen on going home today. Cleared by surgery. Okay for discharge.   PERTINENT LABS:  The results of significant diagnostics from this hospitalization (including imaging, microbiology, ancillary and laboratory) are listed below for reference.    Microbiology: Recent Results (from the past 240 hour(s))  Surgical pcr screen     Status: None   Collection Time: 03/16/15  2:36 AM  Result Value Ref Range Status   MRSA, PCR NEGATIVE NEGATIVE Final   Staphylococcus aureus NEGATIVE NEGATIVE Final    Comment:        The Xpert SA Assay (FDA approved for NASAL specimens in patients over 34 years of age), is one component of a comprehensive surveillance program.  Test performance has been validated by [facility] for patients greater than or equal to 36 year old. It is not intended to diagnose infection nor to guide or monitor treatment.      Labs: Basic Metabolic Panel:  Recent Labs Lab 03/14/15 1630 03/15/15 0446 03/16/15 0349 03/16/15 0945 03/17/15 0313 03/18/15 0340  NA 136 136 133*  --  132* 133*  K 3.7 3.5 4.2  --  3.7 3.3*  CL 100* 101 102  --  101 102  CO2 24 25 24   --  22 24  GLUCOSE 128* 141* 156*  --  128* 93  BUN 9 9 9   --  13 11  CREATININE 1.05 1.01 1.01  --  0.96 0.91  CALCIUM 9.8 9.2 8.7*  --  8.1* 8.3*  MG  --  1.6*  --  2.0  --   --   PHOS  --  3.0  --   --   --   --    Liver Function Tests:  Recent  Labs Lab 03/14/15 1630 03/15/15 0446 03/16/15 0349 03/17/15 0313 03/18/15 0340  AST 92* 87* 63* 36 26  ALT 66* 63 60 40 29  ALKPHOS 103 92 89 79 81  BILITOT 2.3* 3.3* 3.0* 2.2* 2.0*  PROT 7.1 6.9 6.6 5.5* 5.5*  ALBUMIN 3.8 3.7 3.0* 2.5* 2.2*    Recent Labs Lab 03/14/15 1630  LIPASE 16*   CBC:  Recent Labs Lab 03/14/15 1630 03/15/15 0446 03/16/15 0349 03/17/15 0313 03/18/15 0340  WBC 7.2 13.3* 17.9* 13.9* 9.4  NEUTROABS 3.9  --   --   --   --     HGB 16.1 15.5 14.4 13.5 12.2*  HCT 46.4 44.6 41.4 38.9* 35.0*  MCV 101.8* 100.5* 99.8 99.2 98.9  PLT 268 253 218 179 206   Cardiac Enzymes:  Recent Labs Lab 03/14/15 1918  TROPONINI <0.03    IMAGING STUDIES Ct Abdomen Pelvis W Contrast  03/15/2015   CLINICAL DATA:  Right upper quadrant pain and vomiting. History of alcohol abuse and pancreatitis.  EXAM: CT ABDOMEN AND PELVIS WITH CONTRAST  TECHNIQUE: Multidetector CT imaging of the abdomen and pelvis was performed using the standard protocol following bolus administration of intravenous contrast.  CONTRAST:  159mL OMNIPAQUE IOHEXOL 300 MG/ML  SOLN  COMPARISON:  Ultrasound abdomen 03/14/2015. CT abdomen and pelvis 08/14/2014.  FINDINGS: Atelectasis in the lung bases.  Small esophageal hiatal hernia.  Diffuse fatty infiltration of the liver. Gallbladder is mildly distended with mild wall thickening. Cholelithiasis. No bile duct dilatation. Pancreas, spleen, adrenal glands, abdominal aorta, inferior vena cava, and retroperitoneal lymph nodes are unremarkable. Low-attenuation lesions in the kidneys likely representing cysts. Largest on the right is 2.5 cm in diameter. No solid mass or hydronephrosis in either kidney. The stomach, small bowel, and colon are not abnormally distended. No free air or free fluid in the abdomen.  Pelvis: Prostate gland is not enlarged. Bladder wall is not thickened. No free or loculated pelvic fluid collections. No pelvic mass or lymphadenopathy. Appendix is surgically absent. No evidence of diverticulitis. Degenerative changes in the spine. No destructive bone lesions. Old right rib fractures.  IMPRESSION: Diffuse fatty infiltration of the liver. Gallbladder is mildly distended with suggestion of mild wall thickening. Cholelithiasis. No pericholecystic infiltration but cholecystitis is not excluded. Pancreas is normal.   Electronically Signed   By: Lucienne Capers M.D.   On: 03/15/2015 03:37   Mr 3d Recon At  Scanner  03/15/2015   CLINICAL DATA:  Abdominal pain and biliary colic.  EXAM: MRI ABDOMEN WITHOUT CONTRAST  (INCLUDING MRCP)  TECHNIQUE: Multiplanar multisequence MR imaging of the abdomen was performed. Heavily T2-weighted images of the biliary and pancreatic ducts were obtained, and three-dimensional MRCP images were rendered by post processing.  COMPARISON:  03/15/2015  FINDINGS: Significantly diminished exam detail secondary to motion artifact. Patient nauseous during exam. Patient was removed from scanner multiple times.  Lower chest:  No pleural or pericardial effusion.  Hepatobiliary: Diffuse fatty infiltration of the liver. No enhancing liver abnormality identified. Stones within the dependent portion of the gallbladder measure up to 7 mm. There is gallbladder wall thickening and edema identified. No common bile duct dilatation or intrahepatic duct dilatation. Mild periportal edema noted.  Pancreas: Normal appearance of the pancreas  Spleen: The spleen is enlarged measuring 14 cm in length. There is no focal splenic abnormality.  Adrenals/Urinary Tract: The adrenal glands are within normal limits. Normal appearance of the left kidney. There is a cyst within the right kidney measuring 2.1  cm. No focal left kidney abnormality.  Stomach/Bowel: The he stomach is normal. The small bowel loops have a normal course and caliber without evidence for bowel obstruction. Normal signal from within the bone marrow.  Vascular/Lymphatic: Normal appearance of the abdominal aorta. No enlarged upper abdominal lymph nodes identified.  Other: There is a small amount of fluid identified extending around the inferior margin of the right lobe of liver.  Musculoskeletal: Normal signal is identified from within the bone marrow.  IMPRESSION: 1. No acute findings. 2. Gallstones, gallbladder wall thickening and edema consistent with cholecystitis. 3. No evidence for common bile duct stone or biliary obstruction. 4. Hepatic steatosis.    Electronically Signed   By: Kerby Moors M.D.   On: 03/15/2015 15:34   Mr Lambert Mody Cm/mrcp  03/15/2015   CLINICAL DATA:  Abdominal pain and biliary colic.  EXAM: MRI ABDOMEN WITHOUT CONTRAST  (INCLUDING MRCP)  TECHNIQUE: Multiplanar multisequence MR imaging of the abdomen was performed. Heavily T2-weighted images of the biliary and pancreatic ducts were obtained, and three-dimensional MRCP images were rendered by post processing.  COMPARISON:  03/15/2015  FINDINGS: Significantly diminished exam detail secondary to motion artifact. Patient nauseous during exam. Patient was removed from scanner multiple times.  Lower chest:  No pleural or pericardial effusion.  Hepatobiliary: Diffuse fatty infiltration of the liver. No enhancing liver abnormality identified. Stones within the dependent portion of the gallbladder measure up to 7 mm. There is gallbladder wall thickening and edema identified. No common bile duct dilatation or intrahepatic duct dilatation. Mild periportal edema noted.  Pancreas: Normal appearance of the pancreas  Spleen: The spleen is enlarged measuring 14 cm in length. There is no focal splenic abnormality.  Adrenals/Urinary Tract: The adrenal glands are within normal limits. Normal appearance of the left kidney. There is a cyst within the right kidney measuring 2.1 cm. No focal left kidney abnormality.  Stomach/Bowel: The he stomach is normal. The small bowel loops have a normal course and caliber without evidence for bowel obstruction. Normal signal from within the bone marrow.  Vascular/Lymphatic: Normal appearance of the abdominal aorta. No enlarged upper abdominal lymph nodes identified.  Other: There is a small amount of fluid identified extending around the inferior margin of the right lobe of liver.  Musculoskeletal: Normal signal is identified from within the bone marrow.  IMPRESSION: 1. No acute findings. 2. Gallstones, gallbladder wall thickening and edema consistent with cholecystitis. 3.  No evidence for common bile duct stone or biliary obstruction. 4. Hepatic steatosis.   Electronically Signed   By: Kerby Moors M.D.   On: 03/15/2015 15:34   US Abdomen Limited Ruq  03/14/2015   CLINICAL DATA:  Right upper quadrant pain. Prior appendectomy. Hypertension.  EXAM: US ABDOMEN LIMITED - RIGHT UPPER QUADRANT  COMPARISON:  CT abdomen and pelvis 08/14/2014. Ultrasound abdomen 02/12/2014.  FINDINGS: Gallbladder:  Sludge and stones are demonstrated in the dependent portion of the gallbladder. No gallbladder wall thickening. Murphy's sign is negative.  Common bile duct:  Diameter: 5.9 mm, normal  Liver:  Diffusely increased hepatic parenchymal echotexture are most likely to represent fatty infiltration. No discrete focal lesions identified.  IMPRESSION: Cholelithiasis and sludge in the gallbladder. Diffuse fatty infiltration of the liver.   Electronically Signed   By: Lucienne Capers M.D.   On: 03/14/2015 21:56    DISCHARGE EXAMINATION: Filed Vitals:   03/17/15 1348 03/17/15 2202 03/18/15 0223 03/18/15 0522  BP: 151/70 142/69 147/76 134/81  Pulse: 88 78 88 84  Temp: 98.5 F (36.9 C) 99.3 F (37.4 C) 99.1 F (37.3 C) 98.4 F (36.9 C)  TempSrc: Oral Oral Oral Oral  Resp: 20 18 18 18   Height:      Weight:      SpO2: 94% 96% 95% 96%   General appearance: alert, cooperative and no distress Resp: clear to auscultation bilaterally Cardio: regular rate and rhythm, S1, S2 normal, no murmur, click, rub or gallop GI: soft, slightly tender in the right upper quadrant. bowel sounds normal; no masses,  no organomegaly Extremities: extremities normal, atraumatic, no cyanosis or edema Neurologic: No focal deficits  DISPOSITION: Home with family  Discharge Instructions    Call MD for:  extreme fatigue    Complete by:  As directed      Call MD for:  persistant dizziness or light-headedness    Complete by:  As directed      Call MD for:  persistant nausea and vomiting    Complete by:  As  directed      Call MD for:  redness, tenderness, or signs of infection (pain, swelling, redness, odor or green/yellow discharge around incision site)    Complete by:  As directed      Call MD for:  severe uncontrolled pain    Complete by:  As directed      Diet - low sodium heart healthy    Complete by:  As directed      Discharge instructions    Complete by:  As directed   Please follow up with surgeon as instructed. Do not consume any alcohol.  You were cared for by a hospitalist during your hospital stay. If you have any questions about your discharge medications or the care you received while you were in the hospital after you are discharged, you can call the unit and asked to speak with the hospitalist on call if the hospitalist that took care of you is not available. Once you are discharged, your primary care physician will handle any further medical issues. Please note that NO REFILLS for any discharge medications will be authorized once you are discharged, as it is imperative that you return to your primary care physician (or establish a relationship with a primary care physician if you do not have one) for your aftercare needs so that they can reassess your need for medications and monitor your lab values. If you do not have a primary care physician, you can call [phone] for a physician referral.     Increase activity slowly    Complete by:  As directed            ALLERGIES:  Allergies  Allergen Reactions  . Amlodipine Cough  . Codeine Itching and Nausea And Vomiting  . Lisinopril Cough  . Micardis Hct [Telmisartan-Hctz] Other (See Comments) and Cough    anxious  . Olmesartan Cough  . Toprol Xl [Metoprolol Succinate] Cough  . Triamterene Cough     Discharge Medication List as of 03/18/2015  9:49 AM    START taking these medications   Details  butalbital-acetaminophen-caffeine (FIORICET, ESGIC) 50-325-40 MG per tablet Take 1 tablet by mouth every 4 (four) hours as needed  (Pain)., Starting 03/18/2015, Until Discontinued, Print    ciprofloxacin (CIPRO) 500 MG tablet Take 1 tablet (500 mg total) by mouth 2 (two) times daily. For 4 more days, Starting 03/18/2015, Until Discontinued, Normal    Multiple Vitamin (MULTIVITAMIN WITH MINERALS) TABS tablet Take 1 tablet by mouth daily., Starting 03/18/2015, Until  Discontinued, Print      CONTINUE these medications which have CHANGED   Details  fentaNYL (DURAGESIC - DOSED MCG/HR) 25 MCG/HR patch Place 1 patch (25 mcg total) onto the skin every 3 (three) days., Starting 03/18/2015, Until Discontinued, Print      CONTINUE these medications which have NOT CHANGED   Details  allopurinol (ZYLOPRIM) 100 MG tablet Take 100 mg by mouth daily., Until Discontinued, Historical Med    diltiazem (CARTIA XT) 120 MG 24 hr capsule Take 1 capsule (120 mg total) by mouth daily., Starting 12/18/2014, Until Discontinued, Normal    losartan (COZAAR) 25 MG tablet Take 1 tablet (25 mg total) by mouth daily., Starting 12/18/2014, Until Discontinued, Normal    ondansetron (ZOFRAN) 4 MG tablet Take 1 tablet (4 mg total) by mouth every 6 (six) hours., Starting 01/08/2015, Until Discontinued, Print    pantoprazole (PROTONIX) 40 MG tablet Take 40 mg by mouth daily. , Starting 12/22/2014, Until Discontinued, Historical Med    zolpidem (AMBIEN) 10 MG tablet Take 1 tablet (10 mg total) by mouth at bedtime as needed for sleep., Starting 05/27/2014, Until Discontinued, Phone In      STOP taking these medications     oxyCODONE-acetaminophen (PERCOCET/ROXICET) 5-325 MG per tablet        Follow-up Information    Follow up with [facility] On 04/06/2015.   Specialty:  General Surgery   Why:  Your appointment is at 2:45pm, please arrive at least 30 min before your appointment to complete your check in paperwork.  If you are unable to arrive 30 min prior to your appointment time we may have to cancel or reschedule you. For post-operation check    Contact information:   [address] [place] [address] [phone]       TOTAL DISCHARGE TIME: 35 minutes  [facility] Pager [phone]  03/18/2015, 2:02 PM

## 2015-03-29 ENCOUNTER — Other Ambulatory Visit: Payer: Self-pay | Admitting: General Surgery

## 2015-03-29 DIAGNOSIS — R110 Nausea: Secondary | ICD-10-CM

## 2015-03-29 DIAGNOSIS — Z9049 Acquired absence of other specified parts of digestive tract: Secondary | ICD-10-CM

## 2015-03-29 DIAGNOSIS — R52 Pain, unspecified: Secondary | ICD-10-CM

## 2015-05-14 ENCOUNTER — Encounter: Payer: Self-pay | Admitting: Cardiology

## 2015-05-14 ENCOUNTER — Ambulatory Visit (INDEPENDENT_AMBULATORY_CARE_PROVIDER_SITE_OTHER): Payer: Medicare Other | Admitting: Cardiology

## 2015-05-14 VITALS — BP 140/80 | HR 70 | Ht 74.0 in | Wt 178.0 lb

## 2015-05-14 DIAGNOSIS — G4700 Insomnia, unspecified: Secondary | ICD-10-CM | POA: Diagnosis not present

## 2015-05-14 DIAGNOSIS — Z9049 Acquired absence of other specified parts of digestive tract: Secondary | ICD-10-CM

## 2015-05-14 DIAGNOSIS — I119 Hypertensive heart disease without heart failure: Secondary | ICD-10-CM | POA: Diagnosis not present

## 2015-05-14 DIAGNOSIS — Z9889 Other specified postprocedural states: Secondary | ICD-10-CM

## 2015-05-14 NOTE — Patient Instructions (Addendum)
Medication Instructions:  Your physician recommends that you continue on your current medications as directed. Please refer to the Current Medication list given to you today.  Labwork: NONE  Testing/Procedures: NONE  Follow-Up: Your physician wants you to follow-up in: 6 MONTH OV You will receive a reminder letter in the mail two months in advance. If you don't receive a letter, please call our office to schedule the follow-up appointment.     

## 2015-05-14 NOTE — Progress Notes (Signed)
Cardiology Office Note   Date:  05/14/2015   ID:  DESHANNON HINCHLIFFE, DOB 1946-05-21, MRN [id number]  PCP:  Antony Blackbird, MD  Cardiologist: Darlin Coco MD  No chief complaint on file.     History of Present Illness: Brian Villegas is a 69 y.o. male who presents for a scheduled follow-up visit.  This pleasant 69 year old gentleman is seen for a followup office visit. He has a history of essential hypertension. . The patient had a nuclear stress test on 09/01/10 which showed no ischemia and which showed an ejection fraction of 49% and a hypertensive blood pressure response to exercise. More recently the patient had a followup echocardiogram on 10/24/11 which showed that his ejection fraction remains normal at 55-60% and he has grade 1 diastolic dysfunction.   at his last office visit  we reduced his diltiazem to once a day and added losartan 25 mg daily. He has felt well on this new regimen and his blood pressure has been more satisfactory.  Since last visit the patient has been hospitalized for abdominal pain.  He was hospitalized 710/16 until 03/18/15.  He was found to have a gangrenous gallbladder.  He underwent successful laparoscopic cholecystectomy on 03/16/15 by Dr. Rush Farmer.  He did well postoperatively.  He had been losing weight without good reason and it may have been related to his gallbladder disease.  He has had past history of problems with acute pancreatitis as well. Since coming home from the hospital the patient had a blackout spell while walking to the bathroom in the middle of night.  He injured his left ear.  It did not require sutures.  The patient has not been having any dizzy spells.  He thinks that he tripped. He has a past history of moderate alcohol intake although he insists that he has not been drinking recently.  Past Medical History  Diagnosis Date  . Hypertension     multiple medication intolerances  . Polycythemia     requires phlebotomies   . Alcohol  consuption of more than two drinks per day   . Memory loss     "since fall w/concussion in 06/2013" (02/12/2014)  . Concussion with loss of consciousness 06/18/2013  . GERD (gastroesophageal reflux disease)   . Constipation   . Basal cell carcinoma ~ 2012    "MOHS on right neck"  . High cholesterol     "creeping up; not taking RX yet" (02/12/2014)  . Hepatitis 1952    "yellow jaundice"  . Headache(784.0)     "5d X wk since fall w/concussion in 06/2013" (02/12/2014)  . Anxiety   . Pancreatitis     Past Surgical History  Procedure Laterality Date  . US echocardiography  2008    EF 50-55% , trivial AR,hypokinesis of mid inferior wall  . Nuclear stress test  08/2010    EF 49%, Normal  . Cardiac catheterization  2008  . Anterior cervical decomp/discectomy fusion  2010  . Appendectomy    . Shoulder arthroscopy Left ?2007    "labrium repair"  . Cataract extraction w/ intraocular lens  implant, bilateral Bilateral 1990's  . Orif clavicle fracture Left 2006    using DePuy clavicle pin.  . Clavicle hardware removal Left 2007  . Mohs surgery Right ~ 2012    "neck; basal cell"  . Cholecystectomy N/A 03/16/2015    Procedure: LAPAROSCOPIC CHOLECYSTECTOMY;  Surgeon: Coralie Keens, MD;  Location: [facility];  Service: General;  Laterality: N/A;  Current Outpatient Prescriptions  Medication Sig Dispense Refill  . allopurinol (ZYLOPRIM) 100 MG tablet Take 100 mg by mouth daily.    [name] ALPRAZolam (XANAX) 0.5 MG tablet Take 0.25-0.5 mg by mouth daily as needed (anxiety).     [name] diltiazem (CARTIA XT) 120 MG 24 hr capsule Take 1 capsule (120 mg total) by mouth daily. 90 capsule 3  . losartan (COZAAR) 25 MG tablet Take 1 tablet (25 mg total) by mouth daily. 180 tablet 3  . Multiple Vitamin (MULTIVITAMIN WITH MINERALS) TABS tablet Take 1 tablet by mouth daily. 30 tablet 0  . pantoprazole (PROTONIX) 40 MG tablet Take 40 mg by mouth daily.     [name] zolpidem (AMBIEN) 10 MG tablet Take 1 tablet (10 mg  total) by mouth at bedtime as needed for sleep. (Patient taking differently: Take 10 mg by mouth at bedtime. ) 30 tablet 5  . [DISCONTINUED] potassium chloride SA (K-DUR,KLOR-CON) 20 MEQ tablet Take 1 tablet (20 mEq total) by mouth daily. 14 tablet 0   No current facility-administered medications for this visit.    Allergies:   Amlodipine; Amoxicillin; Codeine; Lisinopril; Micardis hct; Olmesartan; Penicillins; Toprol xl; and Triamterene    Social History:  The patient  reports that he has never smoked. He has never used smokeless tobacco. He reports that he drinks about 4.2 oz of alcohol per week. He reports that he does not use illicit drugs.   Family History:  The patient's family history includes Healthy in his brother and daughter; Other in his father and mother.    ROS:  Please see the history of present illness.   Otherwise, review of systems are positive for none.   All other systems are reviewed and negative.    PHYSICAL EXAM: VS:  BP 140/80 mmHg  Pulse 70  Ht 6\' 2"  (1.88 m)  Wt 178 lb (80.74 kg)  BMI 22.84 kg/m2 , BMI Body mass index is 22.84 kg/(m^2). GEN: Well nourished, well developed, in no acute distress HEENT: normal Neck: no JVD, carotid bruits, or masses Cardiac: RRR; no murmurs, rubs, or gallops,no edema  Respiratory:  clear to auscultation bilaterally, normal work of breathing GI: soft, nontender, nondistended, + BS MS: no deformity or atrophy Skin: warm and dry, no rash Neuro:  Strength and sensation are intact Psych: euthymic mood, full affect   EKG:  EKG is ordered today. The ekg ordered today demonstrates normal sinus rhythm with Q waves in inferior leads consistent with old inferior wall myocardial infarction.  Since previous tracing of 03/14/15, no significant change   Recent Labs: 03/15/2015: TSH 1.418 03/16/2015: Magnesium 2.0 03/18/2015: ALT 29; BUN 11; Creatinine, Ser 0.91; Hemoglobin 12.2*; Platelets 206; Potassium 3.3*; Sodium 133*    Lipid  Panel No results found for: CHOL, TRIG, HDL, CHOLHDL, VLDL, LDLCALC, LDLDIRECT    Wt Readings from Last 3 Encounters:  05/14/15 178 lb (80.74 kg)  03/15/15 185 lb 6.5 oz (84.1 kg)  01/08/15 195 lb (88.451 kg)       ASSESSMENT AND PLAN:  1. Essential hypertension. 2. Insomnia 3. Recent acute cholecystitis treated with successful laparoscopic cholecystectomy 4.  Recent mechanical fall with minor injury to left ear. 5.  20 pound weight loss since March 2016.  30 pound weight loss since September 2015.  Continue to monitor now that his gallbladder has been removed.  His appetite is good.  Disposition: We talked about outpatient event monitor to evaluate his mechanical fall versus syncopal episode.  He declines an outpatient event  monitor.  He has had them in the past and they have not shown anything. He was urged to keep his alcohol intake to a minimum His blood pressure today in the office was borderline high.  He will monitor this at home.  Continue current medicine for now.  Recheck in 6 months for follow-up visit  Continue current meds. Recheck in 6 months.   Current medicines are reviewed at length with the patient today.  The patient does not have concerns regarding medicines.  The following changes have been made:  no change  Labs/ tests ordered today include:  No orders of the defined types were placed in this encounter.       Berna Spare MD 05/14/2015 1:14 PM    [facility] [place], [place], [place]  [address] Phone: [phone]; Fax: [phone]

## 2015-08-31 ENCOUNTER — Encounter (HOSPITAL_BASED_OUTPATIENT_CLINIC_OR_DEPARTMENT_OTHER): Payer: Self-pay | Admitting: *Deleted

## 2015-08-31 ENCOUNTER — Emergency Department (HOSPITAL_BASED_OUTPATIENT_CLINIC_OR_DEPARTMENT_OTHER)
Admission: EM | Admit: 2015-08-31 | Discharge: 2015-08-31 | Disposition: A | Discharge status: Home / Self Care | Payer: Medicare Other | Attending: Emergency Medicine | Admitting: Emergency Medicine

## 2015-08-31 DIAGNOSIS — Z8719 Personal history of other diseases of the digestive system: Secondary | ICD-10-CM

## 2015-08-31 DIAGNOSIS — F419 Anxiety disorder, unspecified: Secondary | ICD-10-CM | POA: Diagnosis not present

## 2015-08-31 DIAGNOSIS — R1013 Epigastric pain: Secondary | ICD-10-CM | POA: Insufficient documentation

## 2015-08-31 DIAGNOSIS — Z8639 Personal history of other endocrine, nutritional and metabolic disease: Secondary | ICD-10-CM | POA: Insufficient documentation

## 2015-08-31 DIAGNOSIS — Z87828 Personal history of other (healed) physical injury and trauma: Secondary | ICD-10-CM | POA: Diagnosis not present

## 2015-08-31 DIAGNOSIS — Z862 Personal history of diseases of the blood and blood-forming organs and certain disorders involving the immune mechanism: Secondary | ICD-10-CM | POA: Insufficient documentation

## 2015-08-31 DIAGNOSIS — Z79899 Other long term (current) drug therapy: Secondary | ICD-10-CM | POA: Insufficient documentation

## 2015-08-31 DIAGNOSIS — Z8589 Personal history of malignant neoplasm of other organs and systems: Secondary | ICD-10-CM | POA: Insufficient documentation

## 2015-08-31 DIAGNOSIS — Z9049 Acquired absence of other specified parts of digestive tract: Secondary | ICD-10-CM | POA: Insufficient documentation

## 2015-08-31 DIAGNOSIS — R5383 Other fatigue: Secondary | ICD-10-CM | POA: Diagnosis not present

## 2015-08-31 DIAGNOSIS — I10 Essential (primary) hypertension: Secondary | ICD-10-CM | POA: Diagnosis not present

## 2015-08-31 DIAGNOSIS — Z880 Allergy status to penicillin: Secondary | ICD-10-CM | POA: Insufficient documentation

## 2015-08-31 DIAGNOSIS — Z9889 Other specified postprocedural states: Secondary | ICD-10-CM | POA: Diagnosis not present

## 2015-08-31 LAB — CBC
HEMATOCRIT: 45.4 % (ref 39.0–52.0)
HEMOGLOBIN: 15.9 g/dL (ref 13.0–17.0)
MCH: 35.1 pg — ABNORMAL HIGH (ref 26.0–34.0)
MCHC: 35 g/dL (ref 30.0–36.0)
MCV: 100.2 fL — AB (ref 78.0–100.0)
Platelets: 214 10*3/uL (ref 150–400)
RBC: 4.53 MIL/uL (ref 4.22–5.81)
RDW: 13.6 % (ref 11.5–15.5)
WBC: 6.2 10*3/uL (ref 4.0–10.5)

## 2015-08-31 LAB — URINALYSIS, ROUTINE W REFLEX MICROSCOPIC
Bilirubin Urine: NEGATIVE
Glucose, UA: NEGATIVE mg/dL
Ketones, ur: NEGATIVE mg/dL
Leukocytes, UA: NEGATIVE
NITRITE: NEGATIVE
PROTEIN: NEGATIVE mg/dL
SPECIFIC GRAVITY, URINE: 1.002 — AB (ref 1.005–1.030)
pH: 7 (ref 5.0–8.0)

## 2015-08-31 LAB — COMPREHENSIVE METABOLIC PANEL
ALK PHOS: 106 U/L (ref 38–126)
ALT: 30 U/L (ref 17–63)
AST: 55 U/L — AB (ref 15–41)
Albumin: 3.9 g/dL (ref 3.5–5.0)
Anion gap: 8 (ref 5–15)
BUN: 6 mg/dL (ref 6–20)
CO2: 28 mmol/L (ref 22–32)
CREATININE: 0.8 mg/dL (ref 0.61–1.24)
Calcium: 8.3 mg/dL — ABNORMAL LOW (ref 8.9–10.3)
Chloride: 100 mmol/L — ABNORMAL LOW (ref 101–111)
GFR calc Af Amer: >60 mL/min (ref >60)
GLUCOSE: 117 mg/dL — AB (ref 65–99)
POTASSIUM: 2.9 mmol/L — AB (ref 3.5–5.1)
Sodium: 136 mmol/L (ref 135–145)
TOTAL PROTEIN: 7.2 g/dL (ref 6.5–8.1)
Total Bilirubin: 1.7 mg/dL — ABNORMAL HIGH (ref 0.3–1.2)

## 2015-08-31 LAB — URINE MICROSCOPIC-ADD ON
Squamous Epithelial / LPF: NONE SEEN
WBC UA: NONE SEEN WBC/hpf (ref 0–5)

## 2015-08-31 LAB — LIPASE, BLOOD: Lipase: 27 U/L (ref 11–51)

## 2015-08-31 LAB — AMYLASE: Amylase: 38 U/L (ref 28–100)

## 2015-08-31 NOTE — ED Notes (Addendum)
Pt c/o diffuse abd pain x 3 days HX pancreatitis

## 2015-08-31 NOTE — Discharge Instructions (Signed)
Clear liquid diet for the next 24 hours, then slowly advance to normal.  Return to the emergency department if you develop worsening pain, high fever, bloody stool, or other new and concerning symptoms.   Abdominal Pain, Adult Many things can cause abdominal pain. Usually, abdominal pain is not caused by a disease and will improve without treatment. It can often be observed and treated at home. Your health care provider will do a physical exam and possibly order blood tests and X-rays to help determine the seriousness of your pain. However, in many cases, more time must pass before a clear cause of the pain can be found. Before that point, your health care provider may not know if you need more testing or further treatment. HOME CARE INSTRUCTIONS Monitor your abdominal pain for any changes. The following actions may help to alleviate any discomfort you are experiencing:  Only take over-the-counter or prescription medicines as directed by your health care provider.  Do not take laxatives unless directed to do so by your health care provider.  Try a clear liquid diet (broth, tea, or water) as directed by your health care provider. Slowly move to a bland diet as tolerated. SEEK MEDICAL CARE IF:  You have unexplained abdominal pain.  You have abdominal pain associated with nausea or diarrhea.  You have pain when you urinate or have a bowel movement.  You experience abdominal pain that wakes you in the night.  You have abdominal pain that is worsened or improved by eating food.  You have abdominal pain that is worsened with eating fatty foods.  You have a fever. SEEK IMMEDIATE MEDICAL CARE IF:  Your pain does not go away within 2 hours.  You keep throwing up (vomiting).  Your pain is felt only in portions of the abdomen, such as the right side or the left lower portion of the abdomen.  You pass bloody or black tarry stools. MAKE SURE YOU:  Understand these instructions.  Will  watch your condition.  Will get help right away if you are not doing well or get worse.   This information is not intended to replace advice given to you by your health care provider. Make sure you discuss any questions you have with your health care provider.   Document Released: 05/31/2005 Document Revised: 05/12/2015 Document Reviewed: 04/30/2013 Elsevier Interactive Patient Education [employer].

## 2015-08-31 NOTE — ED Provider Notes (Signed)
CSN: [id number]     Arrival date & time 08/31/15  2009 History  By signing my name below, I, Hansel Feinstein, attest that this documentation has been prepared under the direction and in the presence of Veryl Speak, MD. Electronically Signed: Hansel Feinstein, ED Scribe. 08/31/2015. 11:15 PM.    Chief Complaint  Patient presents with  . Abdominal Pain   Patient is a 69 y.o. male presenting with abdominal pain. The history is provided by the patient. No language interpreter was used.  Abdominal Pain Pain location:  Epigastric Pain quality: cramping and pressure   Pain radiates to:  Does not radiate Pain severity:  Moderate Onset quality:  Gradual Duration:  3 days Timing:  Constant Progression:  Worsening Chronicity:  Recurrent Relieved by:  None tried Worsened by:  Nothing tried Ineffective treatments:  None tried Associated symptoms: fatigue and nausea   Associated symptoms: no chest pain   Fatigue:    Severity:  Mild Nausea:    Severity:  Moderate   Duration:  1 day Risk factors: no alcohol abuse     HPI Comments: BRIK BRADFORD is a 69 y.o. male with h/o HTN, polycythemia, GERD, basal cell carcinoma, HLD, hepatitis, pancreatitis who presents to the Emergency Department complaining of moderate epigastric abdominal pain and pressure onset 3 days ago with associated nausea onset today, generalized fatigue. Pt states h/o similar pain with prior episode of pancreatitis over a year ago. He states that he occasionally drinks a glass of wine, but no longer is a heavy drinker since his last episode of pancreatitis. He denies additional symptoms, CP.    Past Medical History  Diagnosis Date  . Hypertension     multiple medication intolerances  . Polycythemia     requires phlebotomies   . Alcohol consuption of more than two drinks per day   . Memory loss     "since fall w/concussion in 06/2013" (02/12/2014)  . Concussion with loss of consciousness 06/18/2013  . GERD (gastroesophageal reflux  disease)   . Constipation   . Basal cell carcinoma ~ 2012    "MOHS on right neck"  . High cholesterol     "creeping up; not taking RX yet" (02/12/2014)  . Hepatitis 1952    "yellow jaundice"  . Headache(784.0)     "5d X wk since fall w/concussion in 06/2013" (02/12/2014)  . Anxiety   . Pancreatitis    Past Surgical History  Procedure Laterality Date  . US echocardiography  2008    EF 50-55% , trivial AR,hypokinesis of mid inferior wall  . Nuclear stress test  08/2010    EF 49%, Normal  . Cardiac catheterization  2008  . Anterior cervical decomp/discectomy fusion  2010  . Appendectomy    . Shoulder arthroscopy Left ?2007    "labrium repair"  . Cataract extraction w/ intraocular lens  implant, bilateral Bilateral 1990's  . Orif clavicle fracture Left 2006    using DePuy clavicle pin.  . Clavicle hardware removal Left 2007  . Mohs surgery Right ~ 2012    "neck; basal cell"  . Cholecystectomy N/A 03/16/2015    Procedure: LAPAROSCOPIC CHOLECYSTECTOMY;  Surgeon: Coralie Keens, MD;  Location: [facility] OR;  Service: General;  Laterality: N/A;   Family History  Problem Relation Age of Onset  . Other Mother     Died, 90 complicated from esophageal stricutre  . Other Father     Died, 64 from carbon monoxide poisoning  . Healthy Brother   . Healthy  Daughter    Social History  Substance Use Topics  . Smoking status: Never Smoker   . Smokeless tobacco: Never Used  . Alcohol Use: 4.2 oz/week    7 Shots of liquor per week     Comment: 02/12/2014 "1 shot of vodka nightly x 20years"    Review of Systems  Constitutional: Positive for fatigue.  Cardiovascular: Negative for chest pain.  Gastrointestinal: Positive for nausea and abdominal pain.  All other systems reviewed and are negative.  Allergies  Amlodipine; Amoxicillin; Codeine; Lisinopril; Micardis hct; Olmesartan; Penicillins; Toprol xl; and Triamterene  Home Medications   Prior to Admission medications   Medication Sig  Start Date End Date Taking? Authorizing Provider  allopurinol (ZYLOPRIM) 100 MG tablet Take 100 mg by mouth daily.    Historical Provider, MD  ALPRAZolam Duanne Moron) 0.5 MG tablet Take 0.25-0.5 mg by mouth daily as needed (anxiety).  11/18/14   Historical Provider, MD  diltiazem (CARTIA XT) 120 MG 24 hr capsule Take 1 capsule (120 mg total) by mouth daily. 12/18/14   Darlin Coco, MD  losartan (COZAAR) 25 MG tablet Take 1 tablet (25 mg total) by mouth daily. 12/18/14   Darlin Coco, MD  Multiple Vitamin (MULTIVITAMIN WITH MINERALS) TABS tablet Take 1 tablet by mouth daily. 03/18/15   Bonnielee Haff, MD  pantoprazole (PROTONIX) 40 MG tablet Take 40 mg by mouth daily.  12/22/14   Historical Provider, MD  zolpidem (AMBIEN) 10 MG tablet Take 1 tablet (10 mg total) by mouth at bedtime as needed for sleep. Patient taking differently: Take 10 mg by mouth at bedtime.  05/27/14   Darlin Coco, MD   BP 155/89 mmHg  Pulse 81  Temp(Src) 97.5 F (36.4 C) (Oral)  Resp 15  Ht 6\' 2"  (1.88 m)  Wt 190 lb (86.183 kg)  BMI 24.38 kg/m2  SpO2 99% Physical Exam  Constitutional: He is oriented to person, place, and time. He appears well-developed and well-nourished.  HENT:  Head: Normocephalic and atraumatic.  Eyes: EOM are normal.  Neck: Normal range of motion.  Cardiovascular: Normal rate, regular rhythm, normal heart sounds and intact distal pulses.   Pulmonary/Chest: Effort normal and breath sounds normal. No respiratory distress.  Abdominal: Soft. Bowel sounds are normal. He exhibits no distension. There is tenderness. There is no rebound and no guarding.  Mild TTP in the epigastrium.   Musculoskeletal: Normal range of motion.  Neurological: He is alert and oriented to person, place, and time.  Skin: Skin is warm and dry.  Psychiatric: He has a normal mood and affect. Judgment normal.  Nursing note and vitals reviewed.  ED Course  Procedures (including critical care time) DIAGNOSTIC  STUDIES: Oxygen Saturation is 100% on RA, normal by my interpretation.    COORDINATION OF CARE: 11:13 PM Discussed treatment plan with pt at bedside and pt agreed to plan.   Labs Review Labs Reviewed  URINALYSIS, ROUTINE W REFLEX MICROSCOPIC (NOT AT [facility]) - Abnormal; Notable for the following:    Specific Gravity, Urine 1.002 (*)    Hgb urine dipstick TRACE (*)    All other components within normal limits  CBC - Abnormal; Notable for the following:    MCV 100.2 (*)    MCH 35.1 (*)    All other components within normal limits  COMPREHENSIVE METABOLIC PANEL - Abnormal; Notable for the following:    Potassium 2.9 (*)    Chloride 100 (*)    Glucose, Bld 117 (*)    Calcium 8.3 (*)  AST 55 (*)    Total Bilirubin 1.7 (*)    All other components within normal limits  URINE MICROSCOPIC-ADD ON - Abnormal; Notable for the following:    Bacteria, UA RARE (*)    All other components within normal limits  LIPASE, BLOOD  AMYLASE   I have personally reviewed and evaluated these lab results as part of my medical decision-making.  MDM   Final diagnoses:  Epigastric pain  History of pancreatitis    Patient is a 69 year old male with history of pancreatitis in the past. He presents with epigastric pain. His workup reveals a normal lipase and laboratory studies are otherwise unremarkable. This workup was initiated in triage and the patient had a prolonged wait to be seen by a physician due to patient volume this evening. When I arrived in the patient's room, he was in the process of getting dressed and stating that he was going to go home. He does report that he is feeling better. Physical examination was then performed and revealed mild epigastric tenderness, but was otherwise benign.  I informed him of the results of his laboratory studies and we have decided that he will be discharged. He will be recommended to take a clear liquid diet for the next 24 hours, then slowly advance it as  tolerated. He does report drinking some alcohol over the weekend during the holidays. It is possible that he has exacerbated his pancreatitis or possibly developed some gastritis. Either way, he is improving and appears appropriate to be discharged.  I personally performed the services described in this documentation, which was scribed in my presence. The recorded information has been reviewed and is accurate.       Veryl Speak, MD 09/01/15 [phone]

## 2015-09-21 ENCOUNTER — Ambulatory Visit (INDEPENDENT_AMBULATORY_CARE_PROVIDER_SITE_OTHER): Payer: Medicare Other | Admitting: Neurology

## 2015-09-21 ENCOUNTER — Encounter: Payer: Self-pay | Admitting: Neurology

## 2015-09-21 VITALS — BP 100/75 | HR 77 | Ht 74.0 in | Wt 194.0 lb

## 2015-09-21 DIAGNOSIS — R413 Other amnesia: Secondary | ICD-10-CM

## 2015-09-21 DIAGNOSIS — E876 Hypokalemia: Secondary | ICD-10-CM

## 2015-09-21 DIAGNOSIS — G441 Vascular headache, not elsewhere classified: Secondary | ICD-10-CM

## 2015-09-21 DIAGNOSIS — E538 Deficiency of other specified B group vitamins: Secondary | ICD-10-CM

## 2015-09-21 DIAGNOSIS — R51 Headache: Secondary | ICD-10-CM

## 2015-09-21 DIAGNOSIS — R5382 Chronic fatigue, unspecified: Secondary | ICD-10-CM | POA: Diagnosis not present

## 2015-09-21 DIAGNOSIS — G4700 Insomnia, unspecified: Secondary | ICD-10-CM

## 2015-09-21 DIAGNOSIS — R519 Headache, unspecified: Secondary | ICD-10-CM

## 2015-09-21 HISTORY — DX: Chronic fatigue, unspecified: R53.82

## 2015-09-21 HISTORY — DX: Other amnesia: R41.3

## 2015-09-21 HISTORY — DX: Headache, unspecified: R51.9

## 2015-09-21 MED ORDER — TRAZODONE HCL 100 MG PO TABS: 100.0000 mg | ORAL_TABLET | Freq: Every day | ORAL | Status: AC

## 2015-09-21 NOTE — Progress Notes (Signed)
Reason for visit: Fatigue  Referring physician: Dr. Maia Petties is a 70 y.o. male  History of present illness:  Brian Villegas is a 70 year old white male with a history of chronic insomnia. The patient takes Ambien 10 mg at night. He indicates that over the last 2 years, he has had a gradually progressive worsening problem with fatigue, he indicates that when he sleeps better at night he feels better during the day. He indicates some problems with weakness and nausea following physical exertion. He indicates that if he does something physical, he does well initially, but several hours later he has onset of increased fatigue, nausea, and mental clouding. He feels short winded during these events. The patient has tremors off and on. He indicates that he gets about 3 or 4 hours of sleep at night, he has difficulty getting to sleep because he has racing of his thoughts. He does report some problems with anxiety and depression. He reports some troubles with gait instability, he has fallen on occasion. He has headaches following a concussion within the last 6 months. The patient denies any issues controlling the bowels or the bladder. He comes to this office for an evaluation.  Past Medical History  Diagnosis Date  . Hypertension     multiple medication intolerances  . Polycythemia     requires phlebotomies   . Alcohol consuption of more than two drinks per day   . Concussion with loss of consciousness 06/18/2013  . GERD (gastroesophageal reflux disease)   . Constipation   . Basal cell carcinoma ~ 2012    "MOHS on right neck"  . High cholesterol     "creeping up; not taking RX yet" (02/12/2014)  . Hepatitis 1952    "yellow jaundice"  . Headache(784.0)     "5d X wk since fall w/concussion in 06/2013" (02/12/2014)  . Anxiety   . Pancreatitis   . Headache 09/21/2015  . Chronic fatigue 09/21/2015  . Memory disorder 09/21/2015    Past Surgical History  Procedure Laterality Date  . US  echocardiography  2008    EF 50-55% , trivial AR,hypokinesis of mid inferior wall  . Nuclear stress test  08/2010    EF 49%, Normal  . Cardiac catheterization  2008  . Anterior cervical decomp/discectomy fusion  2010  . Appendectomy    . Shoulder arthroscopy Left ?2007    "labrium repair"  . Cataract extraction w/ intraocular lens  implant, bilateral Bilateral 1990's  . Orif clavicle fracture Left 2006    using DePuy clavicle pin.  . Clavicle hardware removal Left 2007  . Mohs surgery Right ~ 2012    "neck; basal cell"  . Cholecystectomy N/A 03/16/2015    Procedure: LAPAROSCOPIC CHOLECYSTECTOMY;  Surgeon: Coralie Keens, MD;  Location: [facility] OR;  Service: General;  Laterality: N/A;    Family History  Problem Relation Age of Onset  . Other Mother     Died, 90 complicated from esophageal stricutre  . Other Father     Died, 26 from carbon monoxide poisoning  . Healthy Brother   . Healthy Daughter     Social history:  reports that he has never smoked. He has never used smokeless tobacco. He reports that he drinks about 4.2 oz of alcohol per week. He reports that he does not use illicit drugs.  Medications:  Prior to Admission medications   Medication Sig Start Date End Date Taking? Authorizing Provider  allopurinol (ZYLOPRIM) 100 MG tablet Take  100 mg by mouth daily.    Historical Provider, MD  ALPRAZolam Duanne Moron) 0.5 MG tablet Take 0.25-0.5 mg by mouth daily as needed (anxiety).  11/18/14   Historical Provider, MD  diltiazem (CARTIA XT) 120 MG 24 hr capsule Take 1 capsule (120 mg total) by mouth daily. 12/18/14   Darlin Coco, MD  losartan (COZAAR) 25 MG tablet Take 1 tablet (25 mg total) by mouth daily. 12/18/14   Darlin Coco, MD  Multiple Vitamin (MULTIVITAMIN WITH MINERALS) TABS tablet Take 1 tablet by mouth daily. 03/18/15   Bonnielee Haff, MD  pantoprazole (PROTONIX) 40 MG tablet Take 40 mg by mouth daily.  12/22/14   Historical Provider, MD  zolpidem (AMBIEN) 10 MG tablet  Take 1 tablet (10 mg total) by mouth at bedtime as needed for sleep. Patient taking differently: Take 10 mg by mouth at bedtime.  05/27/14   Darlin Coco, MD      Allergies  Allergen Reactions  . Amlodipine Cough  . Amoxicillin Other (See Comments)    GI issues  . Codeine Itching and Nausea And Vomiting  . Lisinopril Cough  . Micardis Hct [Telmisartan-Hctz] Other (See Comments) and Cough    anxious  . Olmesartan Cough  . Penicillins Other (See Comments)    GI issues  . Toprol Xl [Metoprolol Succinate] Cough  . Triamterene Cough    ROS:  Out of a complete 14 system review of symptoms, the patient complains only of the following symptoms, and all other reviewed systems are negative.  Fatigue Tinnitus, hearing loss, difficulty swallowing Impotence Feeling cold Joint pain Runny nose  Blood pressure 100/75, pulse 77, height 6\' 2"  (1.88 m), weight 194 lb (87.998 kg).  Physical Exam  General: The patient is alert and cooperative at the time of the examination.  Eyes: Pupils are equal, round, and reactive to light. Discs are flat bilaterally.  Neck: The neck is supple, no carotid bruits are noted.  Respiratory: The respiratory examination is clear.  Cardiovascular: The cardiovascular examination reveals a regular rate and rhythm, no obvious murmurs or rubs are noted.  Skin: Extremities are without significant edema.  Neurologic Exam  Mental status: The patient is alert and oriented x 3 at the time of the examination. The patient has apparent normal recent and remote memory, with an apparently normal attention span and concentration ability. Mini-Mental Status Examination done today shows a total score of 30/30. The patient is able to name 9 four legged animals in 30 seconds.  Cranial nerves: Facial symmetry is present. There is good sensation of the face to pinprick and soft touch bilaterally. The strength of the facial muscles and the muscles to head turning and  shoulder shrug are normal bilaterally. Speech is well enunciated, no aphasia or dysarthria is noted. Extraocular movements are full. Visual fields are full. The tongue is midline, and the patient has symmetric elevation of the soft palate. No obvious hearing deficits are noted.  Motor: The motor testing reveals 5 over 5 strength of all 4 extremities. Good symmetric motor tone is noted throughout.  Sensory: Sensory testing is intact to pinprick, soft touch, vibration sensation, and position sense on all 4 extremities. No evidence of extinction is noted.  Coordination: Cerebellar testing reveals good finger-nose-finger and heel-to-shin bilaterally.  Gait and station: Gait is normal. Tandem gait is slightly unsteady. Romberg is negative. No drift is seen.  Reflexes: Deep tendon reflexes are symmetric and normal bilaterally. Toes are downgoing bilaterally.   Assessment/Plan:  1. Chronic insomnia  2. Chronic fatigue  3. Anxiety and depression  4. Reported memory disturbance  The patient has noted a parallel between his insomnia and increased fatigue, memory problems, and difficulty with functioning with physical activity during the day. The primary issue appears to be the insomnia. He will have blood work done today. We may consider MRI evaluation of the brain in the future, but now we will add trazodone to Ambien at night to see if we can improve the sleep patterns. This may also help anxiety and depression. If this is not effective, mirtazapine may be utilized. The patient will follow-up in 4 months. We will follow the memory issues over time.  Jill Alexanders MD 09/21/2015 2:56 PM  [facility] [address] [place], [place] [address]  Phone [phone] Fax [phone]

## 2015-09-21 NOTE — Patient Instructions (Signed)
Insomnia Insomnia is a sleep disorder that makes it difficult to fall asleep or to stay asleep. Insomnia can cause tiredness (fatigue), low energy, difficulty concentrating, mood swings, and poor performance at work or school.  There are three different ways to classify insomnia:  Difficulty falling asleep.  Difficulty staying asleep.  Waking up too early in the morning. Any type of insomnia can be long-term (chronic) or short-term (acute). Both are common. Short-term insomnia usually lasts for three months or less. Chronic insomnia occurs at least three times a week for longer than three months. CAUSES  Insomnia may be caused by another condition, situation, or substance, such as:  Anxiety.  Certain medicines.  Gastroesophageal reflux disease (GERD) or other gastrointestinal conditions.  Asthma or other breathing conditions.  Restless legs syndrome, sleep apnea, or other sleep disorders.  Chronic pain.  Menopause. This may include hot flashes.  Stroke.  Abuse of alcohol, tobacco, or illegal drugs.  Depression.  Caffeine.   Neurological disorders, such as Alzheimer disease.  An overactive thyroid (hyperthyroidism). The cause of insomnia may not be known. RISK FACTORS Risk factors for insomnia include:  Gender. Women are more commonly affected than men.  Age. Insomnia is more common as you get older.  Stress. This may involve your professional or personal life.  Income. Insomnia is more common in people with lower income.  Lack of exercise.   Irregular work schedule or night shifts.  Traveling between different time zones. SIGNS AND SYMPTOMS If you have insomnia, trouble falling asleep or trouble staying asleep is the main symptom. This may lead to other symptoms, such as:  Feeling fatigued.  Feeling nervous about going to sleep.  Not feeling rested in the morning.  Having trouble concentrating.  Feeling irritable, anxious, or depressed. TREATMENT   Treatment for insomnia depends on the cause. If your insomnia is caused by an underlying condition, treatment will focus on addressing the condition. Treatment may also include:   Medicines to help you sleep.  Counseling or therapy.  Lifestyle adjustments. HOME CARE INSTRUCTIONS   Take medicines only as directed by your health care provider.  Keep regular sleeping and waking hours. Avoid naps.  Keep a sleep diary to help you and your health care provider figure out what could be causing your insomnia. Include:   When you sleep.  When you wake up during the night.  How well you sleep.   How rested you feel the next day.  Any side effects of medicines you are taking.  What you eat and drink.   Make your bedroom a comfortable place where it is easy to fall asleep:  Put up shades or special blackout curtains to block light from outside.  Use a white noise machine to block noise.  Keep the temperature cool.   Exercise regularly as directed by your health care provider. Avoid exercising right before bedtime.  Use relaxation techniques to manage stress. Ask your health care provider to suggest some techniques that may work well for you. These may include:  Breathing exercises.  Routines to release muscle tension.  Visualizing peaceful scenes.  Cut back on alcohol, caffeinated beverages, and cigarettes, especially close to bedtime. These can disrupt your sleep.  Do not overeat or eat spicy foods right before bedtime. This can lead to digestive discomfort that can make it hard for you to sleep.  Limit screen use before bedtime. This includes:  Watching TV.  Using your smartphone, tablet, and computer.  Stick to a routine. This   can help you fall asleep faster. Try to do a quiet activity, brush your teeth, and go to bed at the same time each night.  Get out of bed if you are still awake after 15 minutes of trying to sleep. Keep the lights down, but try reading or  doing a quiet activity. When you feel sleepy, go back to bed.  Make sure that you drive carefully. Avoid driving if you feel very sleepy.  Keep all follow-up appointments as directed by your health care provider. This is important. SEEK MEDICAL CARE IF:   You are tired throughout the day or have trouble in your daily routine due to sleepiness.  You continue to have sleep problems or your sleep problems get worse. SEEK IMMEDIATE MEDICAL CARE IF:   You have serious thoughts about hurting yourself or someone else.   This information is not intended to replace advice given to you by your health care provider. Make sure you discuss any questions you have with your health care provider.   Document Released: 08/18/2000 Document Revised: 05/12/2015 Document Reviewed: 05/22/2014 Elsevier Interactive Patient Education 2016 [employer]  

## 2015-09-22 ENCOUNTER — Telehealth: Payer: Self-pay | Admitting: Neurology

## 2015-09-22 LAB — ANA W/REFLEX: ANA: NEGATIVE

## 2015-09-22 LAB — COMPREHENSIVE METABOLIC PANEL
A/G RATIO: 1.4 (ref 1.1–2.5)
ALBUMIN: 4 g/dL (ref 3.5–4.8)
ALT: 52 IU/L — ABNORMAL HIGH (ref 0–44)
AST: 105 IU/L — ABNORMAL HIGH (ref 0–40)
Alkaline Phosphatase: 164 IU/L — ABNORMAL HIGH (ref 39–117)
BUN/Creatinine Ratio: 14 (ref 10–22)
BUN: 13 mg/dL (ref 8–27)
Bilirubin Total: 1.5 mg/dL — ABNORMAL HIGH (ref 0.0–1.2)
CHLORIDE: 101 mmol/L (ref 96–106)
CO2: 30 mmol/L — ABNORMAL HIGH (ref 18–29)
Calcium: 9.2 mg/dL (ref 8.6–10.2)
Creatinine, Ser: 0.91 mg/dL (ref 0.76–1.27)
GFR, EST AFRICAN AMERICAN: 98 mL/min/{1.73_m2} (ref >59)
GFR, EST NON AFRICAN AMERICAN: 85 mL/min/{1.73_m2} (ref >59)
GLOBULIN, TOTAL: 2.8 g/dL (ref 1.5–4.5)
Glucose: 112 mg/dL — ABNORMAL HIGH (ref 65–99)
POTASSIUM: 4.7 mmol/L (ref 3.5–5.2)
SODIUM: 141 mmol/L (ref 134–144)
TOTAL PROTEIN: 6.8 g/dL (ref 6.0–8.5)

## 2015-09-22 LAB — AMMONIA: Ammonia: 84 ug/dL (ref 27–102)

## 2015-09-22 LAB — VITAMIN B12: Vitamin B-12: 254 pg/mL (ref 211–946)

## 2015-09-22 LAB — SEDIMENTATION RATE: SED RATE: 2 mm/h (ref 0–30)

## 2015-09-22 LAB — B. BURGDORFI ANTIBODIES: Lyme IgG/IgM Ab: <0.91 {ISR} (ref 0.00–0.90)

## 2015-09-22 LAB — COPPER, SERUM: Copper: 111 ug/dL (ref 72–166)

## 2015-09-22 LAB — RPR: RPR Ser Ql: NONREACTIVE

## 2015-09-22 LAB — TSH: TSH: 2.5 u[IU]/mL (ref 0.450–4.500)

## 2015-09-22 LAB — ACETYLCHOLINE RECEPTOR, BINDING: AChR Binding Ab, Serum: 0.05 nmol/L (ref 0.00–0.24)

## 2015-09-22 LAB — HIV ANTIBODY (ROUTINE TESTING W REFLEX): HIV SCREEN 4TH GENERATION: NONREACTIVE

## 2015-09-22 LAB — CK: CK TOTAL: 28 U/L (ref 24–204)

## 2015-09-22 NOTE — Telephone Encounter (Signed)
I called the patient. The blood work revealed evidence of elevated liver enzymes with alkaline phosphatase, SGOT and SGPT. The liver enzymes have continue to elevate even over the last 3 weeks. I will send the blood work to the primary care physician. The patient may require further evaluation for this issue.

## 2015-11-17 ENCOUNTER — Emergency Department (HOSPITAL_COMMUNITY)
Admission: EM | Admit: 2015-11-17 | Discharge: 2015-11-17 | Disposition: A | Discharge status: Home / Self Care | Payer: Medicare Other | Attending: Emergency Medicine | Admitting: Emergency Medicine

## 2015-11-17 DIAGNOSIS — Z880 Allergy status to penicillin: Secondary | ICD-10-CM | POA: Insufficient documentation

## 2015-11-17 DIAGNOSIS — M549 Dorsalgia, unspecified: Secondary | ICD-10-CM | POA: Diagnosis not present

## 2015-11-17 DIAGNOSIS — F1020 Alcohol dependence, uncomplicated: Secondary | ICD-10-CM

## 2015-11-17 DIAGNOSIS — Z85828 Personal history of other malignant neoplasm of skin: Secondary | ICD-10-CM | POA: Diagnosis not present

## 2015-11-17 DIAGNOSIS — Z79899 Other long term (current) drug therapy: Secondary | ICD-10-CM | POA: Insufficient documentation

## 2015-11-17 DIAGNOSIS — G9340 Encephalopathy, unspecified: Secondary | ICD-10-CM | POA: Insufficient documentation

## 2015-11-17 DIAGNOSIS — Z8639 Personal history of other endocrine, nutritional and metabolic disease: Secondary | ICD-10-CM | POA: Diagnosis not present

## 2015-11-17 DIAGNOSIS — F1310 Sedative, hypnotic or anxiolytic abuse, uncomplicated: Secondary | ICD-10-CM | POA: Insufficient documentation

## 2015-11-17 DIAGNOSIS — G9349 Other encephalopathy: Secondary | ICD-10-CM

## 2015-11-17 DIAGNOSIS — Z862 Personal history of diseases of the blood and blood-forming organs and certain disorders involving the immune mechanism: Secondary | ICD-10-CM | POA: Insufficient documentation

## 2015-11-17 DIAGNOSIS — R531 Weakness: Secondary | ICD-10-CM | POA: Diagnosis present

## 2015-11-17 DIAGNOSIS — I10 Essential (primary) hypertension: Secondary | ICD-10-CM | POA: Insufficient documentation

## 2015-11-17 DIAGNOSIS — Z9889 Other specified postprocedural states: Secondary | ICD-10-CM | POA: Diagnosis not present

## 2015-11-17 DIAGNOSIS — K219 Gastro-esophageal reflux disease without esophagitis: Secondary | ICD-10-CM | POA: Insufficient documentation

## 2015-11-17 DIAGNOSIS — F419 Anxiety disorder, unspecified: Secondary | ICD-10-CM | POA: Diagnosis not present

## 2015-11-17 LAB — BASIC METABOLIC PANEL
ANION GAP: 15 (ref 5–15)
BUN: 10 mg/dL (ref 6–20)
CHLORIDE: 102 mmol/L (ref 101–111)
CO2: 22 mmol/L (ref 22–32)
Calcium: 8.4 mg/dL — ABNORMAL LOW (ref 8.9–10.3)
Creatinine, Ser: 1 mg/dL (ref 0.61–1.24)
GFR calc Af Amer: >60 mL/min (ref >60)
GFR calc non Af Amer: >60 mL/min (ref >60)
Glucose, Bld: 116 mg/dL — ABNORMAL HIGH (ref 65–99)
POTASSIUM: 3.3 mmol/L — AB (ref 3.5–5.1)
SODIUM: 139 mmol/L (ref 135–145)

## 2015-11-17 LAB — HEPATIC FUNCTION PANEL
ALBUMIN: 3.7 g/dL (ref 3.5–5.0)
ALK PHOS: 51 U/L (ref 38–126)
ALT: 13 U/L — ABNORMAL LOW (ref 17–63)
AST: 16 U/L (ref 15–41)
BILIRUBIN DIRECT: 0.1 mg/dL (ref 0.1–0.5)
BILIRUBIN TOTAL: 0.3 mg/dL (ref 0.3–1.2)
Indirect Bilirubin: 0.2 mg/dL — ABNORMAL LOW (ref 0.3–0.9)
Total Protein: 7.3 g/dL (ref 6.5–8.1)

## 2015-11-17 LAB — CBG MONITORING, ED: Glucose-Capillary: 114 mg/dL — ABNORMAL HIGH (ref 65–99)

## 2015-11-17 LAB — RAPID URINE DRUG SCREEN, HOSP PERFORMED
Amphetamines: NOT DETECTED
BARBITURATES: NOT DETECTED
BENZODIAZEPINES: POSITIVE — AB
COCAINE: NOT DETECTED
OPIATES: NOT DETECTED
Tetrahydrocannabinol: NOT DETECTED

## 2015-11-17 LAB — CBC
HEMATOCRIT: 41.7 % (ref 39.0–52.0)
Hemoglobin: 14.8 g/dL (ref 13.0–17.0)
MCH: 36.1 pg — ABNORMAL HIGH (ref 26.0–34.0)
MCHC: 35.5 g/dL (ref 30.0–36.0)
MCV: 101.7 fL — AB (ref 78.0–100.0)
Platelets: 223 10*3/uL (ref 150–400)
RBC: 4.1 MIL/uL — AB (ref 4.22–5.81)
RDW: 14 % (ref 11.5–15.5)
WBC: 10.4 10*3/uL (ref 4.0–10.5)

## 2015-11-17 LAB — URINE MICROSCOPIC-ADD ON

## 2015-11-17 LAB — URINALYSIS, ROUTINE W REFLEX MICROSCOPIC
GLUCOSE, UA: NEGATIVE mg/dL
Hgb urine dipstick: NEGATIVE
Ketones, ur: NEGATIVE mg/dL
NITRITE: POSITIVE — AB
PH: 5.5 (ref 5.0–8.0)
PROTEIN: 30 mg/dL — AB
Specific Gravity, Urine: 1.027 (ref 1.005–1.030)

## 2015-11-17 LAB — AMMONIA: Ammonia: 31 umol/L (ref 9–35)

## 2015-11-17 LAB — ETHANOL: Alcohol, Ethyl (B): <5 mg/dL (ref <5)

## 2015-11-17 LAB — PROTIME-INR
INR: 1.07 (ref 0.00–1.49)
PROTHROMBIN TIME: 14.1 s (ref 11.6–15.2)

## 2015-11-17 MED ORDER — SODIUM CHLORIDE 0.9 % IV BOLUS (SEPSIS)
500.0000 mL | Freq: Once | INTRAVENOUS | Status: AC
  Administered 2015-11-17: 500 mL via INTRAVENOUS

## 2015-11-17 MED ORDER — SODIUM CHLORIDE 0.9 % IV SOLN
INTRAVENOUS | Status: DC
  Administered 2015-11-17: 18:00:00 via INTRAVENOUS

## 2015-11-17 NOTE — ED Provider Notes (Signed)
CSN: [id number]     Arrival date & time 11/17/15  1420 History   First MD Initiated Contact with Patient 11/17/15 1523     Chief Complaint  Patient presents with  . Weakness  . Back Pain  . Dehydration     (Consider location/radiation/quality/duration/timing/severity/associated sxs/prior Treatment) HPI   Brian Villegas is a 70 y.o. male here for evaluation of "dehydration". He states he awoke this morning, felt dehydrated. He admits to heavy alcohol consumption. He also states that he sees flashing lights in front of his eyes and when he closes his eyes, he sees images, in front of both eyes. He denies headache, chest pain, back pain, weakness or dizziness. He did not eat anything this morning. His wife states that he has fallen several times with various injuries to his extremities. He saw his neurologist couple of months ago. At that time was evaluated for memory loss. He has known "liver disease", and despite this continues to drink alcohol. He is retired. He lives with his wife who is here and is supportive. There are no other known modifying factors.   Past Medical History  Diagnosis Date  . Hypertension     multiple medication intolerances  . Polycythemia     requires phlebotomies   . Alcohol consuption of more than two drinks per day   . Concussion with loss of consciousness 06/18/2013  . GERD (gastroesophageal reflux disease)   . Constipation   . Basal cell carcinoma ~ 2012    "MOHS on right neck"  . High cholesterol     "creeping up; not taking RX yet" (02/12/2014)  . Hepatitis 1952    "yellow jaundice"  . Headache(784.0)     "5d X wk since fall w/concussion in 06/2013" (02/12/2014)  . Anxiety   . Pancreatitis   . Headache 09/21/2015  . Chronic fatigue 09/21/2015  . Memory disorder 09/21/2015   Past Surgical History  Procedure Laterality Date  . US echocardiography  2008    EF 50-55% , trivial AR,hypokinesis of mid inferior wall  . Nuclear stress test  08/2010    EF  49%, Normal  . Cardiac catheterization  2008  . Anterior cervical decomp/discectomy fusion  2010  . Appendectomy    . Shoulder arthroscopy Left ?2007    "labrium repair"  . Cataract extraction w/ intraocular lens  implant, bilateral Bilateral 1990's  . Orif clavicle fracture Left 2006    using DePuy clavicle pin.  . Clavicle hardware removal Left 2007  . Mohs surgery Right ~ 2012    "neck; basal cell"  . Cholecystectomy N/A 03/16/2015    Procedure: LAPAROSCOPIC CHOLECYSTECTOMY;  Surgeon: Coralie Keens, MD;  Location: [facility] OR;  Service: General;  Laterality: N/A;   Family History  Problem Relation Age of Onset  . Other Mother     Died, 90 complicated from esophageal stricutre  . Other Father     Died, 32 from carbon monoxide poisoning  . Healthy Brother   . Healthy Daughter    Social History  Substance Use Topics  . Smoking status: Never Smoker   . Smokeless tobacco: Never Used  . Alcohol Use: 4.2 oz/week    7 Shots of liquor per week     Comment: 02/12/2014 "1 shot of vodka nightly x 20years"    Review of Systems  All other systems reviewed and are negative.     Allergies  Amlodipine; Amoxicillin; Codeine; Lisinopril; Micardis hct; Olmesartan; Penicillins; Toprol xl; and Triamterene  Home Medications  Prior to Admission medications   Medication Sig Start Date End Date Taking? Authorizing Provider  allopurinol (ZYLOPRIM) 100 MG tablet Take 100 mg by mouth daily as needed (gout).    Yes Historical Provider, MD  ALPRAZolam Duanne Moron) 0.5 MG tablet Take 0.5 mg by mouth daily as needed for anxiety (anxiety).  11/18/14  Yes Historical Provider, MD  diltiazem (CARTIA XT) 120 MG 24 hr capsule Take 1 capsule (120 mg total) by mouth daily. 12/18/14  Yes Darlin Coco, MD  losartan (COZAAR) 25 MG tablet Take 1 tablet (25 mg total) by mouth daily. 12/18/14  Yes Darlin Coco, MD  pantoprazole (PROTONIX) 40 MG tablet Take 40 mg by mouth daily.  12/22/14  Yes Historical Provider,  MD  traZODone (DESYREL) 100 MG tablet Take 1 tablet (100 mg total) by mouth at bedtime. Patient taking differently: Take 100 mg by mouth at bedtime as needed for sleep.  09/21/15  Yes Kathrynn Ducking, MD  zolpidem (AMBIEN) 10 MG tablet Take 1 tablet (10 mg total) by mouth at bedtime as needed for sleep. Patient taking differently: Take 10 mg by mouth at bedtime.  05/27/14  Yes Darlin Coco, MD  Multiple Vitamin (MULTIVITAMIN WITH MINERALS) TABS tablet Take 1 tablet by mouth daily. Patient not taking: Reported on 11/17/2015 03/18/15   Bonnielee Haff, MD   BP 157/82 mmHg  Pulse 94  Temp(Src) 98 F (36.7 C) (Oral)  Resp 18  Ht 6\' 2"  (1.88 m)  Wt 185 lb (83.915 kg)  BMI 23.74 kg/m2  SpO2 97% Physical Exam  Constitutional: He is oriented to person, place, and time. He appears well-developed and well-nourished. No distress.  HENT:  Head: Normocephalic and atraumatic.  Right Ear: External ear normal.  Left Ear: External ear normal.  Eyes: Conjunctivae and EOM are normal. Pupils are equal, round, and reactive to light.  Neck: Normal range of motion and phonation normal. Neck supple.  Cardiovascular: Normal rate, regular rhythm and normal heart sounds.   Pulmonary/Chest: Effort normal and breath sounds normal. He exhibits no bony tenderness.  Abdominal: Soft. There is no tenderness.  Musculoskeletal: Normal range of motion. He exhibits no edema or tenderness.  Neurological: He is alert and oriented to person, place, and time. No cranial nerve deficit or sensory deficit. He exhibits normal muscle tone. Coordination normal.  No dysarthria or aphasia  Skin: Skin is warm, dry and intact.  Psychiatric: He has a normal mood and affect. His behavior is normal. Judgment and thought content normal.  Nursing note and vitals reviewed.   ED Course  Procedures (including critical care time)  Medications  0.9 %  sodium chloride infusion ( Intravenous New Bag/Given 11/17/15 1750)  sodium chloride  0.9 % bolus 500 mL (0 mLs Intravenous Stopped 11/17/15 1745)    Patient Vitals for the past 24 hrs:  BP Temp Temp src Pulse Resp SpO2 Height Weight  11/17/15 1813 157/82 mmHg - - 94 18 97 % - -  11/17/15 1701 138/80 mmHg - - 85 18 99 % - -  11/17/15 1436 102/60 mmHg 98 F (36.7 C) Oral 91 16 99 % 6\' 2"  (1.88 m) 185 lb (83.915 kg)    7:45 PM Reevaluation with update and discussion. After initial assessment and treatment, an updated evaluation reveals Patient is comfortable, alert, lucid. He has no further complaints. He is tolerating nutrition and fluids. He is able to ambulate easily. Findings discussed with patient, all questions answered. Patient will call his wife to have her come and  get him and I will also discuss the findings with her prior to his departure. Sissi Padia L    Labs Review Labs Reviewed  BASIC METABOLIC PANEL - Abnormal; Notable for the following:    Potassium 3.3 (*)    Glucose, Bld 116 (*)    Calcium 8.4 (*)    All other components within normal limits  CBC - Abnormal; Notable for the following:    RBC 4.10 (*)    MCV 101.7 (*)    MCH 36.1 (*)    All other components within normal limits  URINALYSIS, ROUTINE W REFLEX MICROSCOPIC (NOT AT [facility]) - Abnormal; Notable for the following:    APPearance CLOUDY (*)    Bilirubin Urine MODERATE (*)    Protein, ur 30 (*)    Nitrite POSITIVE (*)    Leukocytes, UA SMALL (*)    All other components within normal limits  URINE RAPID DRUG SCREEN, HOSP PERFORMED - Abnormal; Notable for the following:    Benzodiazepines POSITIVE (*)    All other components within normal limits  HEPATIC FUNCTION PANEL - Abnormal; Notable for the following:    ALT 13 (*)    Indirect Bilirubin 0.2 (*)    All other components within normal limits  URINE MICROSCOPIC-ADD ON - Abnormal; Notable for the following:    Squamous Epithelial / LPF 0-5 (*)    Bacteria, UA MANY (*)    Casts HYALINE CASTS (*)    All other components within normal  limits  CBG MONITORING, ED - Abnormal; Notable for the following:    Glucose-Capillary 114 (*)    All other components within normal limits  URINE CULTURE  AMMONIA  ETHANOL  PROTIME-INR    Imaging Review No results found. I have personally reviewed and evaluated these images and lab results as part of my medical decision-making.   EKG Interpretation   Date/Time:  Wednesday November 17 2015 15:19:34 EDT Ventricular Rate:  88 PR Interval:  152 QRS Duration: 92 QT Interval:  397 QTC Calculation: 480 R Axis:   -63 Text Interpretation:  Sinus rhythm LAD, consider LAFB or inferior infarct  Low voltage, extremity leads Abnormal R-wave progression, late transition  Borderline prolonged QT interval since last tracing no significant change  Confirmed by Eulis Foster  MD, Dalessandro Baldyga [id number]) on 11/17/2015 3:26:24 PM      MDM   Final diagnoses:  Alcoholism ([place])  Encephalopathy chronic    Nonspecific mild encephalopathy, subacute, likely related to age-related changes, can't have alcohol use. No overt signs for intoxication, alcoholic total syndrome are acute CNS abnormalities. Doubt CVA, meningitis, encephalitis or metabolic instability.  Nursing Notes Reviewed/ Care Coordinated Applicable Imaging Reviewed Interpretation of Laboratory Data incorporated into ED treatment  The patient appears reasonably screened and/or stabilized for discharge and I doubt any other medical condition or other [facility] requiring further screening, evaluation, or treatment in the ED at this time prior to discharge.  Plan: Home Medications- usual; Home Treatments- rest, decrease EtOH; return here if the recommended treatment, does not improve the symptoms; Recommended follow up- PCP and Neurology asap     Daleen Bo, MD 11/17/15 2014

## 2015-11-17 NOTE — ED Notes (Signed)
Pt ambulated to the bathroom with no assistance.

## 2015-11-17 NOTE — ED Notes (Addendum)
Pt presents with multiple complaints.  Flank pain bilaterally but more on the right side.  New onset of "dementia like symptoms" began Saturday.  Forgetful, no short term memory, weakness, all began Saturday.  Pt reports waking today feeling dehydrated but denies vomiting or diarrhea.

## 2015-11-17 NOTE — Discharge Instructions (Signed)

## 2015-11-17 NOTE — ED Notes (Signed)
Pt given ginger ale and graham crackers.

## 2015-11-19 LAB — URINE CULTURE: SPECIAL REQUESTS: NORMAL

## 2015-12-06 ENCOUNTER — Other Ambulatory Visit: Payer: Self-pay | Admitting: Gastroenterology

## 2015-12-06 DIAGNOSIS — R1310 Dysphagia, unspecified: Secondary | ICD-10-CM

## 2015-12-09 ENCOUNTER — Ambulatory Visit
Admission: RE | Admit: 2015-12-09 | Discharge: 2015-12-09 | Disposition: A | Discharge status: Home / Self Care | Payer: Medicare Other | Source: Ambulatory Visit | Attending: Gastroenterology | Admitting: Gastroenterology

## 2015-12-09 DIAGNOSIS — R1310 Dysphagia, unspecified: Secondary | ICD-10-CM

## 2015-12-13 ENCOUNTER — Other Ambulatory Visit: Payer: Self-pay | Admitting: *Deleted

## 2015-12-13 MED ORDER — LOSARTAN POTASSIUM 25 MG PO TABS: 25.0000 mg | ORAL_TABLET | Freq: Every day | ORAL | Status: AC

## 2016-01-14 ENCOUNTER — Encounter (HOSPITAL_COMMUNITY): Admission: RE | Payer: Self-pay | Source: Ambulatory Visit

## 2016-01-14 ENCOUNTER — Ambulatory Visit (HOSPITAL_COMMUNITY): Admission: RE | Admit: 2016-01-14 | Payer: Medicare Other | Source: Ambulatory Visit | Admitting: Gastroenterology

## 2016-01-14 SURGERY — MANOMETRY, ESOPHAGUS

## 2016-01-17 ENCOUNTER — Encounter: Payer: Medicare Other | Admitting: Cardiovascular Disease

## 2016-01-26 ENCOUNTER — Ambulatory Visit: Payer: Medicare Other | Admitting: Neurology

## 2016-01-26 ENCOUNTER — Telehealth: Payer: Self-pay

## 2016-01-26 NOTE — Telephone Encounter (Signed)
Pt no showed f/u appt today.  

## 2016-01-27 ENCOUNTER — Encounter: Payer: Self-pay | Admitting: Neurology

## 2016-01-28 ENCOUNTER — Encounter: Payer: Self-pay | Admitting: Cardiovascular Disease

## 2016-06-04 DEATH — deceased

## 2017-01-13 IMAGING — DX DG ABDOMEN ACUTE W/ 1V CHEST
3 series · 3 of 3 positions shown · non-contrast
Comparison: CT, 08/14/2014

CLINICAL DATA: Pt co abdominal pain since [REDACTED]. Chest pain for
two days. Some sob, dizziness, nausea

EXAM:
DG ABDOMEN ACUTE W/ 1V CHEST

[chest pa]
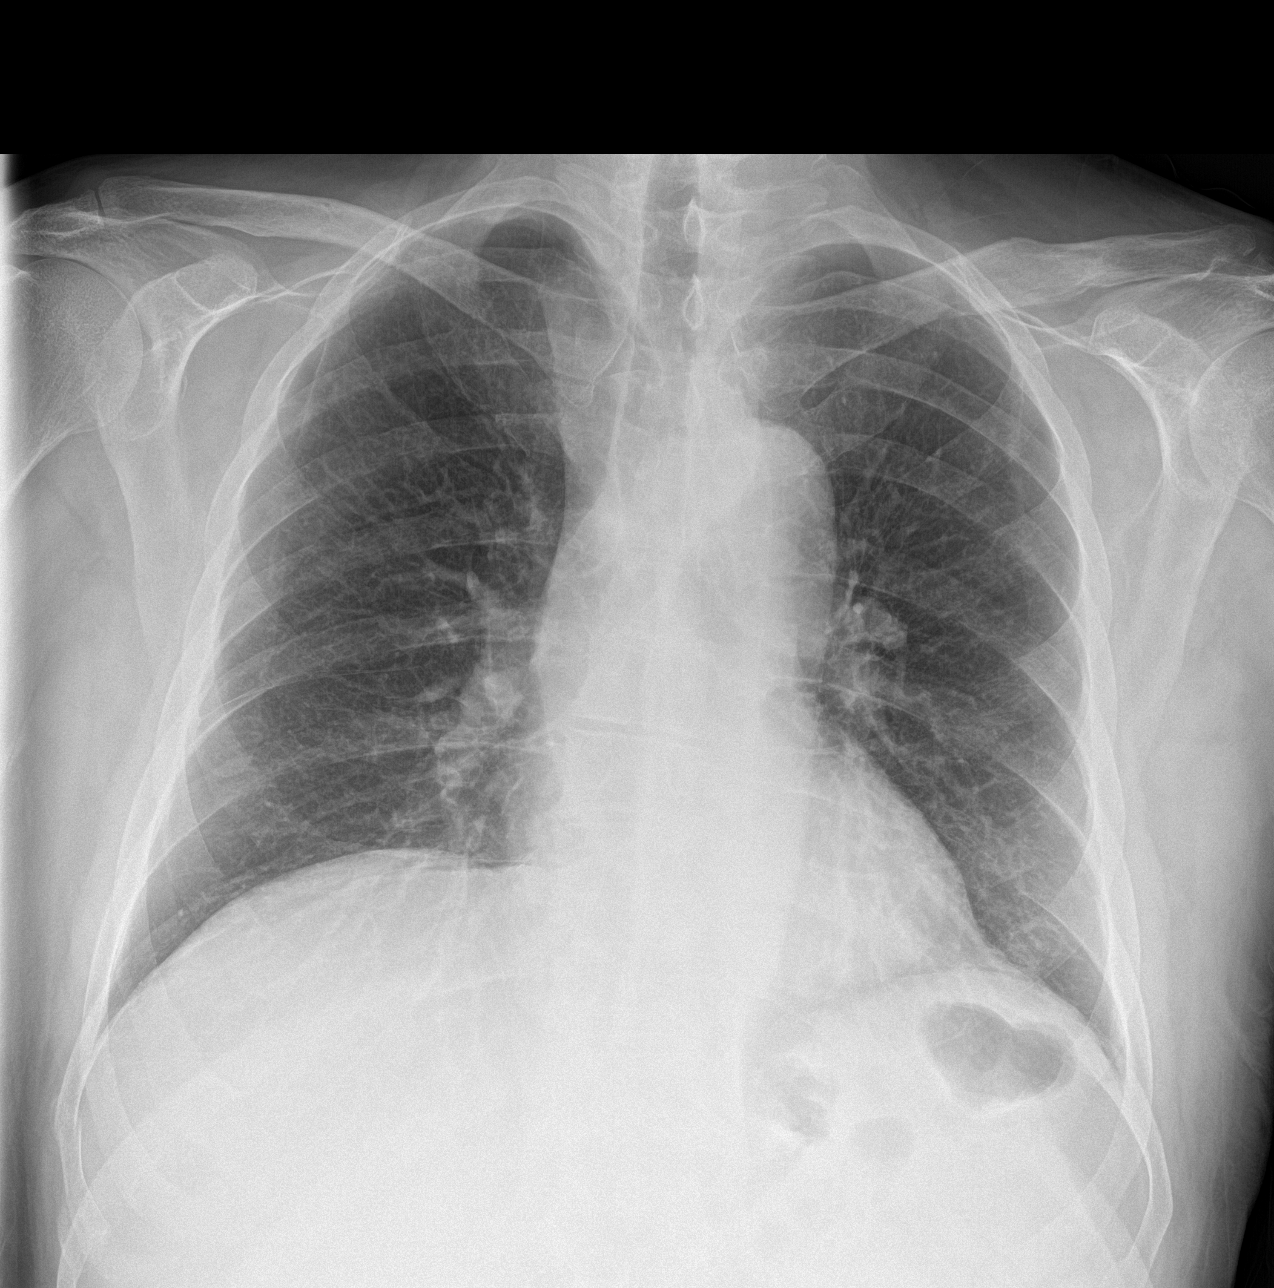

[abdomen erect]
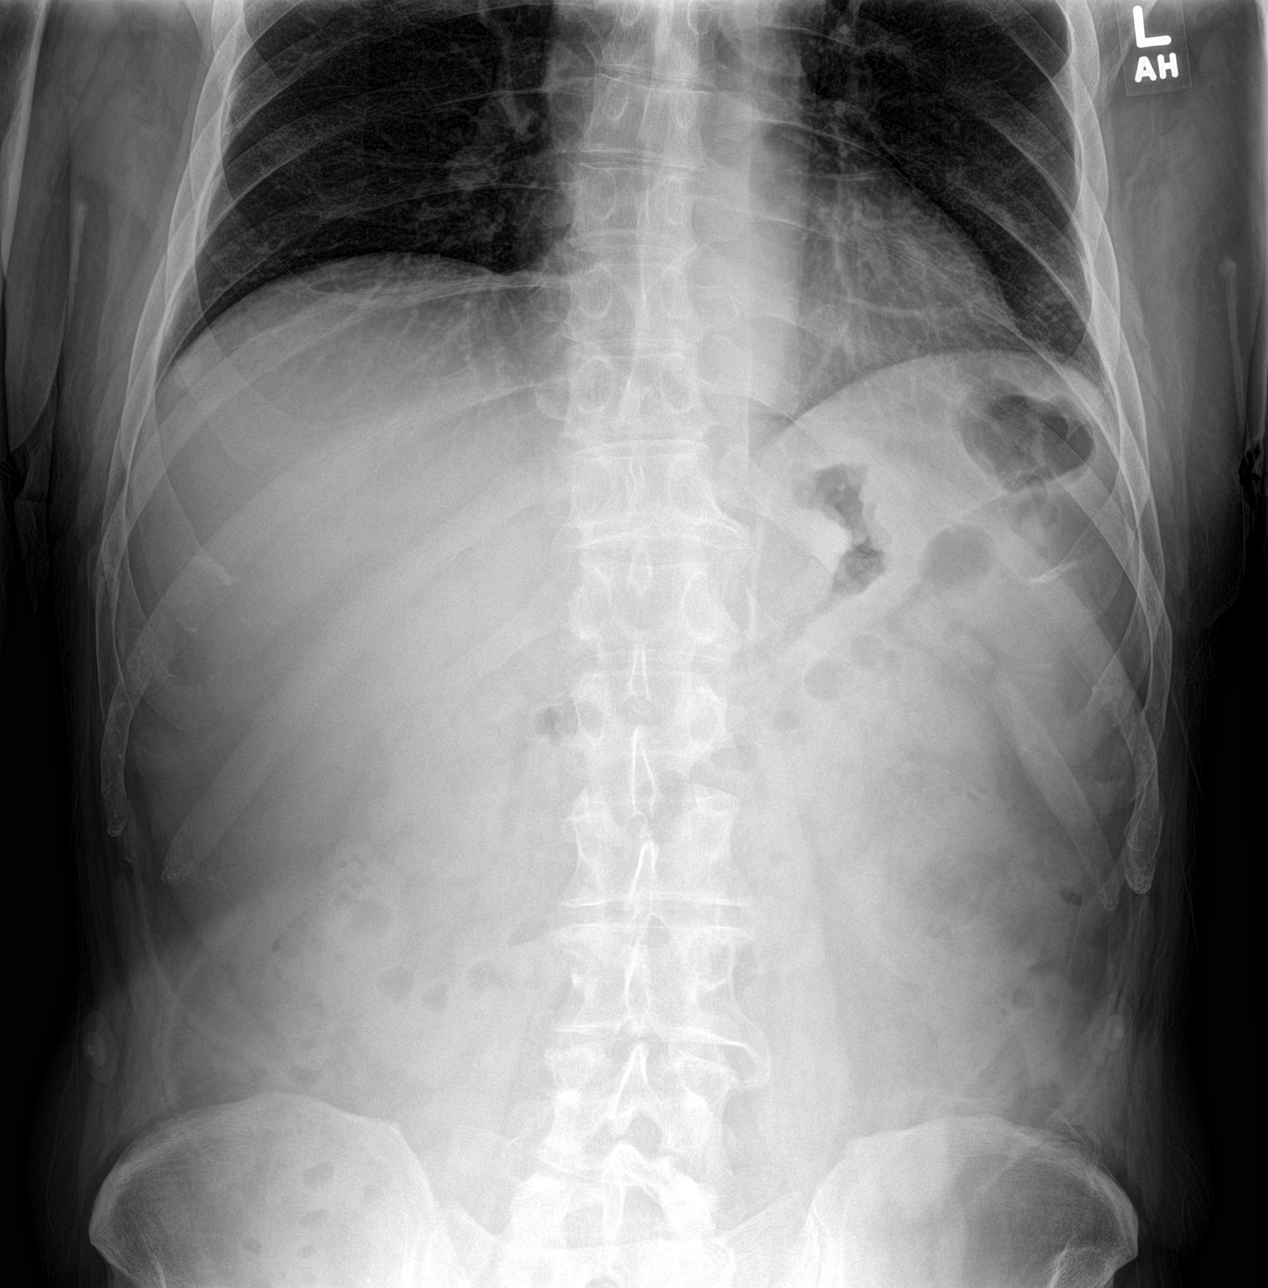

[abdomen supine]
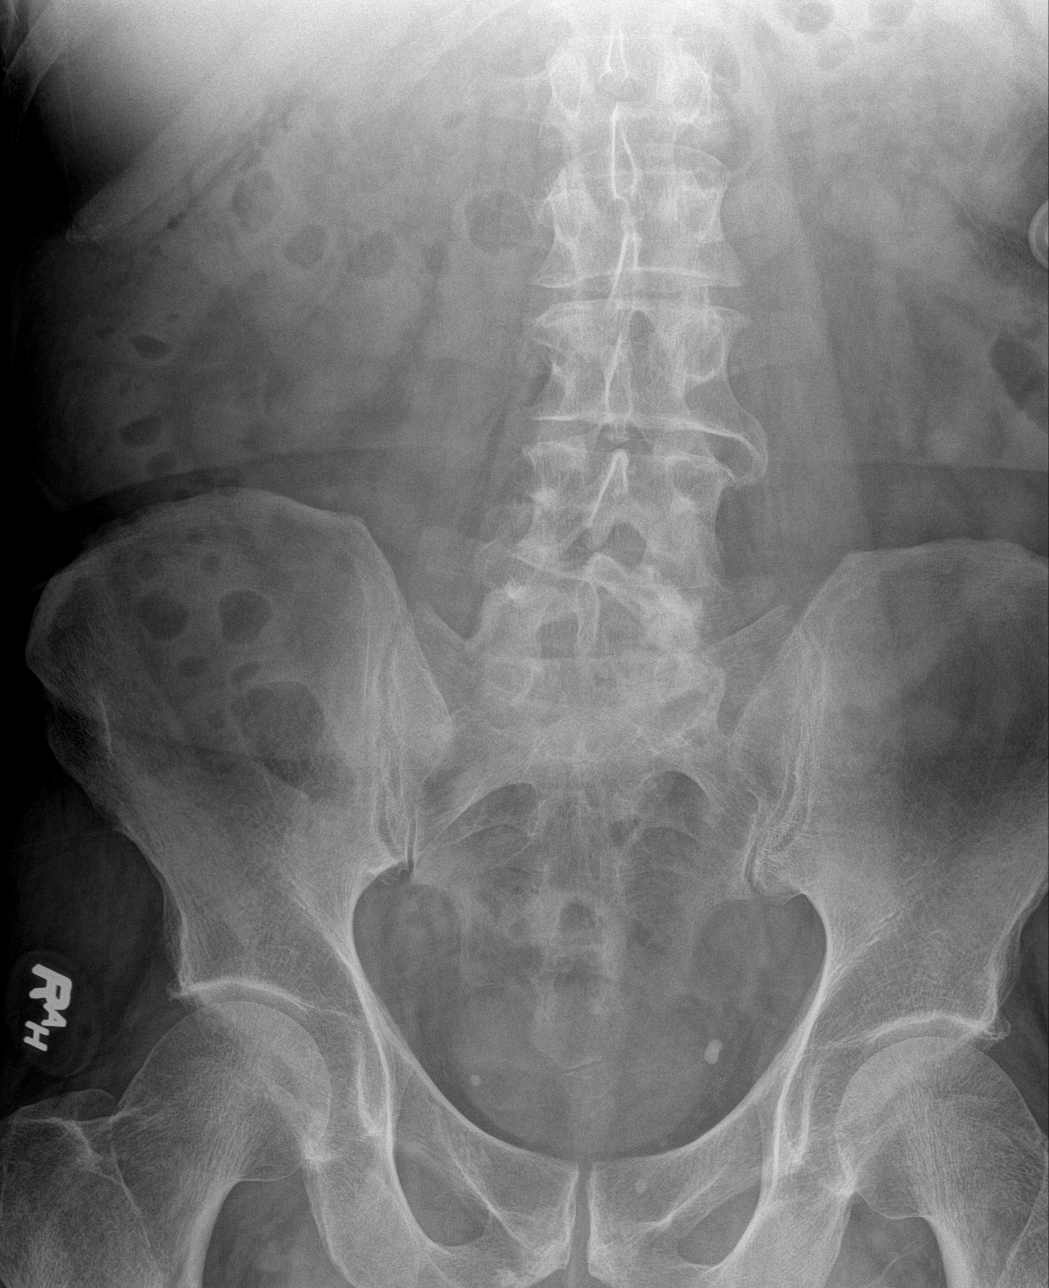

[3 of 3 positions shown; findings below may reference images not displayed]

FINDINGS: Normal bowel gas pattern. No free air. No evidence of renal or
ureteral stones. Soft tissues are unremarkable.

Cardiac silhouette is normal in size. No mediastinal or hilar
masses. Clear lungs.

Changes from a previous anterior cervical spine fusion. Mild
levoscoliosis of the lumbar spine with degenerative disc changes.
IMPRESSION: 1. No acute findings in the abdomen. No evidence of bowel
obstruction, generalized adynamic ileus or free air.
2. No acute cardiopulmonary disease.
